# Patient Record
Sex: Female | Born: 1935 | Race: White | Hispanic: No | Marital: Married | Smoking: Never smoker
Health system: Southern US, Community
[De-identification: ages and names within clinical notes are randomized; demographics above are authoritative.]

## PROBLEM LIST (undated history)

## (undated) DIAGNOSIS — C4490 Unspecified malignant neoplasm of skin, unspecified: Secondary | ICD-10-CM

## (undated) DIAGNOSIS — E119 Type 2 diabetes mellitus without complications: Secondary | ICD-10-CM

## (undated) DIAGNOSIS — F0390 Unspecified dementia without behavioral disturbance: Secondary | ICD-10-CM

---

## 2015-11-21 DIAGNOSIS — F02818 Dementia in other diseases classified elsewhere, unspecified severity, with other behavioral disturbance: Secondary | ICD-10-CM | POA: Diagnosis present

## 2015-11-21 DIAGNOSIS — G301 Alzheimer's disease with late onset: Secondary | ICD-10-CM

## 2015-11-21 DIAGNOSIS — F0281 Dementia in other diseases classified elsewhere with behavioral disturbance: Secondary | ICD-10-CM | POA: Diagnosis present

## 2016-02-20 DIAGNOSIS — E034 Atrophy of thyroid (acquired): Secondary | ICD-10-CM | POA: Diagnosis present

## 2016-07-14 ENCOUNTER — Emergency Department
Admission: EM | Admit: 2016-07-14 | Discharge: 2016-07-14 | Disposition: A | Discharge status: Home / Self Care | Payer: Medicare Other | Attending: Student in an Organized Health Care Education/Training Program | Admitting: Student in an Organized Health Care Education/Training Program

## 2016-07-14 ENCOUNTER — Encounter: Payer: Self-pay | Admitting: *Deleted

## 2016-07-14 DIAGNOSIS — R531 Weakness: Secondary | ICD-10-CM

## 2016-07-14 DIAGNOSIS — N3000 Acute cystitis without hematuria: Secondary | ICD-10-CM | POA: Diagnosis not present

## 2016-07-14 DIAGNOSIS — Z791 Long term (current) use of non-steroidal anti-inflammatories (NSAID): Secondary | ICD-10-CM | POA: Diagnosis not present

## 2016-07-14 HISTORY — DX: Unspecified dementia, unspecified severity, without behavioral disturbance, psychotic disturbance, mood disturbance, and anxiety: F03.90

## 2016-07-14 LAB — CBC
HCT: 35.2 % (ref 35.0–47.0)
Hemoglobin: 12.1 g/dL (ref 12.0–16.0)
MCH: 31.8 pg (ref 26.0–34.0)
MCHC: 34.4 g/dL (ref 32.0–36.0)
MCV: 92.4 fL (ref 80.0–100.0)
PLATELETS: 231 10*3/uL (ref 150–440)
RBC: 3.81 MIL/uL (ref 3.80–5.20)
RDW: 14.6 % — AB (ref 11.5–14.5)
WBC: 9.7 10*3/uL (ref 3.6–11.0)

## 2016-07-14 LAB — URINALYSIS, COMPLETE (UACMP) WITH MICROSCOPIC
BACTERIA UA: NONE SEEN
BILIRUBIN URINE: NEGATIVE
GLUCOSE, UA: NEGATIVE mg/dL
Ketones, ur: NEGATIVE mg/dL
NITRITE: POSITIVE — AB
PH: 6 (ref 5.0–8.0)
Protein, ur: 30 mg/dL — AB
Specific Gravity, Urine: 1.012 (ref 1.005–1.030)

## 2016-07-14 LAB — BASIC METABOLIC PANEL
Anion gap: 9 (ref 5–15)
BUN: 17 mg/dL (ref 6–20)
CALCIUM: 8.5 mg/dL — AB (ref 8.9–10.3)
CO2: 23 mmol/L (ref 22–32)
CREATININE: 0.76 mg/dL (ref 0.44–1.00)
Chloride: 105 mmol/L (ref 101–111)
GFR calc Af Amer: >60 mL/min (ref >60)
GLUCOSE: 113 mg/dL — AB (ref 65–99)
POTASSIUM: 3.6 mmol/L (ref 3.5–5.1)
SODIUM: 137 mmol/L (ref 135–145)

## 2016-07-14 MED ORDER — CEFTRIAXONE SODIUM-DEXTROSE 1-3.74 GM-% IV SOLR
1.0000 g | Freq: Once | INTRAVENOUS | Status: AC
  Administered 2016-07-14: 1 g via INTRAVENOUS
  Filled 2016-07-14: qty 50

## 2016-07-14 MED ORDER — CEPHALEXIN 500 MG PO CAPS: 500.0000 mg | ORAL_CAPSULE | Freq: Three times a day (TID) | ORAL | 0 refills | Status: AC

## 2016-07-14 MED ORDER — SODIUM CHLORIDE 0.9 % IV BOLUS (SEPSIS)
1000.0000 mL | Freq: Once | INTRAVENOUS | Status: AC
  Administered 2016-07-14: 1000 mL via INTRAVENOUS

## 2016-07-14 MED ORDER — DEXTROSE 5 % IV SOLN: 1.0000 g | Freq: Once | INTRAVENOUS | Status: DC

## 2016-07-14 NOTE — ED Triage Notes (Signed)
Daughter states pt had several bowel movements yesterday, states today she continued to have bowel movements but states she could barley walk, daughter states she does not drink much fluids, hx of dementia

## 2016-07-14 NOTE — ED Provider Notes (Addendum)
[facility] Emergency Department Provider Note    First MD Initiated Contact with Patient 07/14/16 1500     (approximate)  I have reviewed the triage vital signs and the nursing notes.   HISTORY  Chief Complaint Weakness  Level V Caveat:  dementia HPI Suzanne Contreras is a 83 y.o. female presents after episode of weakness and staggering gait after getting out of the bathroom today.  History provided by daughter.  Patient was at home with her daughter. Has significant dementia. Daughter states that she has strong line with keeping her well-hydrated at home. No measured fevers. No chest pain or shortness of breath no nausea or vomiting. Does report increased bowel movements but no significant diarrhea. States that coming out of the bathroom today she had an unsteady gait which is unusual for her. Daughter brought her to be checked out.  Patient denies any pain at this time.   Past Medical History:  Diagnosis Date  . Dementia    No family history on file. History reviewed. No pertinent surgical history. There are no active problems to display for this patient.     Prior to Admission medications   Medication Sig Start Date End Date Taking? Authorizing Provider  naproxen sodium (ANAPROX) 220 MG tablet Take 220 mg by mouth 2 (two) times daily with a meal.   Yes Historical Provider, MD    Allergies Penicillins    Social History Social History  Substance Use Topics  . Smoking status: Never Smoker  . Smokeless tobacco: Never Used  . Alcohol use No    Review of Systems Patient denies headaches, rhinorrhea, blurry vision, numbness, shortness of breath, chest pain, edema, cough, abdominal pain, nausea, vomiting, diarrhea, dysuria, fevers, rashes or hallucinations unless otherwise stated above in HPI. ____________________________________________   PHYSICAL EXAM:  VITAL SIGNS: Vitals:   07/14/16 1210 07/14/16 1502  BP: (!) 145/116 (!) 125/108    Pulse: 74 78  Resp: 16 16  Temp: 98.8 F (37.1 C)     Constitutional: Alert, elderly appearing and in no acute distress. Eyes: Conjunctivae are normal. PERRL. EOMI. Head: Atraumatic. Nose: No congestion/rhinnorhea. Mouth/Throat: Mucous membranes are moist.  Oropharynx non-erythematous. Neck: No stridor. Painless ROM. No cervical spine tenderness to palpation Hematological/Lymphatic/Immunilogical: No cervical lymphadenopathy. Cardiovascular: Normal rate, regular rhythm. Grossly normal heart sounds.  Good peripheral circulation. Respiratory: Normal respiratory effort.  No retractions. Lungs CTAB. Gastrointestinal: Soft and nontender. No distention. No abdominal bruits. No CVA tenderness. Musculoskeletal: No lower extremity tenderness nor edema.  No joint effusions. Neurologic:  Normal speech  No gross focal neurologic deficits are appreciated. Nno facial droop.  MAE spontaneously Skin:  Skin is warm, dry and intact. No rash noted. Psychiatric: Mood and affect are normal. Speech and behavior are normal.  ____________________________________________   LABS (all labs ordered are listed, but only abnormal results are displayed)  Results for orders placed or performed during the hospital encounter of 07/14/16 (from the past 24 hour(s))  Basic metabolic panel     Status: Abnormal   Collection Time: 07/14/16 12:18 PM  Result Value Ref Range   Sodium 137 135 - 145 mmol/L   Potassium 3.6 3.5 - 5.1 mmol/L   Chloride 105 101 - 111 mmol/L   CO2 23 22 - 32 mmol/L   Glucose, Bld 113 (H) 65 - 99 mg/dL   BUN 17 6 - 20 mg/dL   Creatinine, Ser 0.76 0.44 - 1.00 mg/dL   Calcium 8.5 (L) 8.9 - 10.3 mg/dL  GFR calc non Af Amer >60 >60 mL/min   GFR calc Af Amer >60 >60 mL/min   Anion gap 9 5 - 15  CBC     Status: Abnormal   Collection Time: 07/14/16 12:18 PM  Result Value Ref Range   WBC 9.7 3.6 - 11.0 K/uL   RBC 3.81 3.80 - 5.20 MIL/uL   Hemoglobin 12.1 12.0 - 16.0 g/dL   HCT 35.2 35.0 -  47.0 %   MCV 92.4 80.0 - 100.0 fL   MCH 31.8 26.0 - 34.0 pg   MCHC 34.4 32.0 - 36.0 g/dL   RDW 14.6 (H) 11.5 - 14.5 %   Platelets 231 150 - 440 K/uL  Urinalysis, Complete w Microscopic     Status: Abnormal   Collection Time: 07/14/16  2:04 PM  Result Value Ref Range   Color, Urine YELLOW (A) YELLOW   APPearance HAZY (A) CLEAR   Specific Gravity, Urine 1.012 1.005 - 1.030   pH 6.0 5.0 - 8.0   Glucose, UA NEGATIVE NEGATIVE mg/dL   Hgb urine dipstick SMALL (A) NEGATIVE   Bilirubin Urine NEGATIVE NEGATIVE   Ketones, ur NEGATIVE NEGATIVE mg/dL   Protein, ur 30 (A) NEGATIVE mg/dL   Nitrite POSITIVE (A) NEGATIVE   Leukocytes, UA LARGE (A) NEGATIVE   RBC / HPF 0-5 0 - 5 RBC/hpf   WBC, UA TOO NUMEROUS TO COUNT 0 - 5 WBC/hpf   Bacteria, UA NONE SEEN NONE SEEN   Squamous Epithelial / LPF 0-5 (A) NONE SEEN   Mucous PRESENT    ____________________________________________  EKG ____________________________________________   ____________________________________________   PROCEDURES  Procedure(s) performed:  Procedures    Critical Care performed: no ____________________________________________   INITIAL IMPRESSION / ASSESSMENT AND PLAN / ED COURSE  Pertinent labs & imaging results that were available during my care of the patient were reviewed by me and considered in my medical decision making (see chart for details).  DDX: dehydration, flu like illness, uti,   Suzanne Contreras is a 83 y.o. who presents to the ED with weakness as described above occurred today. Also concern for dehydration. Patient afebrile and hemodynamically stable. Blood work is reassuring. Urine does show evidence of UTI. Abdominal exam otherwise reassuring. Denies any chest pain or shortness of breath to suggest any cardiac event and EKG also reassuring. We'll provide IV fluids as well as an IV dose of antibiotics to treat UTI. Spoke with the patient's daughter regarding admission to hospital for further  monitoring and IV antibiotics. She states the patient would fare better at home with less agitation given her history of dementia. She has no other signs of systemic illness a do feel that the reasonable option. Patient was able to tolerate PO and was able to ambulate with a steady gait.  Have discussed with the patient and available family all diagnostics and treatments performed thus far and all questions were answered to the best of my ability. The patient demonstrates understanding and agreement with plan.       ____________________________________________   FINAL CLINICAL IMPRESSION(S) / ED DIAGNOSES  Final diagnoses:  Acute cystitis without hematuria  Weakness      NEW MEDICATIONS STARTED DURING THIS VISIT:  New Prescriptions   No medications on file     Note:  This document was prepared using Dragon voice recognition software and may include unintentional dictation errors.    Merlyn Lot, MD 07/15/16 [id number]    Merlyn Lot, MD 07/15/16 [phone]

## 2016-07-17 LAB — URINE CULTURE
Culture: >=100000 — AB
SPECIAL REQUESTS: NORMAL

## 2016-10-22 DIAGNOSIS — N949 Unspecified condition associated with female genital organs and menstrual cycle: Secondary | ICD-10-CM | POA: Diagnosis present

## 2016-12-10 ENCOUNTER — Observation Stay
Admission: EM | Admit: 2016-12-10 | Discharge: 2016-12-14 | Disposition: A | Discharge status: Home / Self Care | Payer: Medicare Other | Attending: Internal Medicine | Admitting: Internal Medicine

## 2016-12-10 ENCOUNTER — Emergency Department: Payer: Medicare Other

## 2016-12-10 ENCOUNTER — Encounter: Payer: Self-pay | Admitting: Emergency Medicine

## 2016-12-10 DIAGNOSIS — E034 Atrophy of thyroid (acquired): Secondary | ICD-10-CM | POA: Diagnosis not present

## 2016-12-10 DIAGNOSIS — E860 Dehydration: Secondary | ICD-10-CM | POA: Insufficient documentation

## 2016-12-10 DIAGNOSIS — F0280 Dementia in other diseases classified elsewhere without behavioral disturbance: Secondary | ICD-10-CM | POA: Diagnosis not present

## 2016-12-10 DIAGNOSIS — R410 Disorientation, unspecified: Secondary | ICD-10-CM | POA: Diagnosis present

## 2016-12-10 DIAGNOSIS — Z818 Family history of other mental and behavioral disorders: Secondary | ICD-10-CM | POA: Diagnosis not present

## 2016-12-10 DIAGNOSIS — R627 Adult failure to thrive: Secondary | ICD-10-CM | POA: Diagnosis not present

## 2016-12-10 DIAGNOSIS — Z85828 Personal history of other malignant neoplasm of skin: Secondary | ICD-10-CM | POA: Diagnosis not present

## 2016-12-10 DIAGNOSIS — N390 Urinary tract infection, site not specified: Secondary | ICD-10-CM | POA: Insufficient documentation

## 2016-12-10 DIAGNOSIS — N3090 Cystitis, unspecified without hematuria: Secondary | ICD-10-CM

## 2016-12-10 DIAGNOSIS — F02818 Dementia in other diseases classified elsewhere, unspecified severity, with other behavioral disturbance: Secondary | ICD-10-CM | POA: Diagnosis present

## 2016-12-10 DIAGNOSIS — R112 Nausea with vomiting, unspecified: Secondary | ICD-10-CM | POA: Insufficient documentation

## 2016-12-10 DIAGNOSIS — N949 Unspecified condition associated with female genital organs and menstrual cycle: Secondary | ICD-10-CM | POA: Diagnosis present

## 2016-12-10 DIAGNOSIS — Z791 Long term (current) use of non-steroidal anti-inflammatories (NSAID): Secondary | ICD-10-CM | POA: Diagnosis not present

## 2016-12-10 DIAGNOSIS — R4182 Altered mental status, unspecified: Secondary | ICD-10-CM | POA: Diagnosis present

## 2016-12-10 DIAGNOSIS — D649 Anemia, unspecified: Secondary | ICD-10-CM | POA: Insufficient documentation

## 2016-12-10 DIAGNOSIS — E1165 Type 2 diabetes mellitus with hyperglycemia: Secondary | ICD-10-CM | POA: Insufficient documentation

## 2016-12-10 DIAGNOSIS — F0281 Dementia in other diseases classified elsewhere with behavioral disturbance: Secondary | ICD-10-CM | POA: Diagnosis present

## 2016-12-10 DIAGNOSIS — Z880 Allergy status to penicillin: Secondary | ICD-10-CM | POA: Insufficient documentation

## 2016-12-10 DIAGNOSIS — G301 Alzheimer's disease with late onset: Secondary | ICD-10-CM | POA: Diagnosis not present

## 2016-12-10 DIAGNOSIS — N899 Noninflammatory disorder of vagina, unspecified: Secondary | ICD-10-CM | POA: Insufficient documentation

## 2016-12-10 HISTORY — DX: Type 2 diabetes mellitus without complications: E11.9

## 2016-12-10 LAB — CBC WITH DIFFERENTIAL/PLATELET
BASOS ABS: 0.1 10*3/uL (ref 0–0.1)
Basophils Relative: 1 %
Eosinophils Absolute: 0.1 10*3/uL (ref 0–0.7)
Eosinophils Relative: 1 %
HEMATOCRIT: 34.1 % — AB (ref 35.0–47.0)
HEMOGLOBIN: 11.5 g/dL — AB (ref 12.0–16.0)
LYMPHS PCT: 20 %
Lymphs Abs: 1.6 10*3/uL (ref 1.0–3.6)
MCH: 31.3 pg (ref 26.0–34.0)
MCHC: 33.8 g/dL (ref 32.0–36.0)
MCV: 92.5 fL (ref 80.0–100.0)
MONO ABS: 0.4 10*3/uL (ref 0.2–0.9)
MONOS PCT: 5 %
NEUTROS ABS: 5.7 10*3/uL (ref 1.4–6.5)
Neutrophils Relative %: 73 %
Platelets: 251 10*3/uL (ref 150–440)
RBC: 3.69 MIL/uL — ABNORMAL LOW (ref 3.80–5.20)
RDW: 14.2 % (ref 11.5–14.5)
WBC: 7.8 10*3/uL (ref 3.6–11.0)

## 2016-12-10 LAB — BASIC METABOLIC PANEL
ANION GAP: 8 (ref 5–15)
BUN: 22 mg/dL — ABNORMAL HIGH (ref 6–20)
CO2: 23 mmol/L (ref 22–32)
Calcium: 9.4 mg/dL (ref 8.9–10.3)
Chloride: 109 mmol/L (ref 101–111)
Creatinine, Ser: 0.75 mg/dL (ref 0.44–1.00)
GFR calc Af Amer: >60 mL/min (ref >60)
GFR calc non Af Amer: >60 mL/min (ref >60)
GLUCOSE: 112 mg/dL — AB (ref 65–99)
Potassium: 3.9 mmol/L (ref 3.5–5.1)
Sodium: 140 mmol/L (ref 135–145)

## 2016-12-10 LAB — URINALYSIS, COMPLETE (UACMP) WITH MICROSCOPIC
Bilirubin Urine: NEGATIVE
GLUCOSE, UA: NEGATIVE mg/dL
Hgb urine dipstick: NEGATIVE
Ketones, ur: 5 mg/dL — AB
Nitrite: POSITIVE — AB
PH: 6 (ref 5.0–8.0)
Protein, ur: NEGATIVE mg/dL
Specific Gravity, Urine: 1.018 (ref 1.005–1.030)

## 2016-12-10 LAB — MAGNESIUM: Magnesium: 1.7 mg/dL (ref 1.7–2.4)

## 2016-12-10 LAB — T4, FREE: Free T4: 0.91 ng/dL (ref 0.61–1.12)

## 2016-12-10 LAB — TSH: TSH: 10.883 u[IU]/mL — ABNORMAL HIGH (ref 0.350–4.500)

## 2016-12-10 MED ORDER — LORAZEPAM 2 MG/ML IJ SOLN
INTRAMUSCULAR | Status: AC
  Filled 2016-12-10: qty 1

## 2016-12-10 MED ORDER — ONDANSETRON HCL 4 MG PO TABS: 4.0000 mg | ORAL_TABLET | Freq: Four times a day (QID) | ORAL | Status: DC | PRN

## 2016-12-10 MED ORDER — DEXTROSE 5 % IV SOLN
1.0000 g | Freq: Once | INTRAVENOUS | Status: AC
  Administered 2016-12-10: 1 g via INTRAVENOUS
  Filled 2016-12-10: qty 10

## 2016-12-10 MED ORDER — HALOPERIDOL LACTATE 5 MG/ML IJ SOLN
1.0000 mg | Freq: Four times a day (QID) | INTRAMUSCULAR | Status: DC | PRN
  Administered 2016-12-10: 22:00:00 1 mg via INTRAVENOUS
  Filled 2016-12-10: qty 1

## 2016-12-10 MED ORDER — LORAZEPAM 2 MG/ML IJ SOLN
1.0000 mg | Freq: Once | INTRAMUSCULAR | Status: AC
  Administered 2016-12-10: 1 mg via INTRAVENOUS

## 2016-12-10 MED ORDER — ACETAMINOPHEN 325 MG PO TABS: 650.0000 mg | ORAL_TABLET | Freq: Four times a day (QID) | ORAL | Status: DC | PRN

## 2016-12-10 MED ORDER — HALOPERIDOL LACTATE 5 MG/ML IJ SOLN
0.5000 mg | Freq: Three times a day (TID) | INTRAMUSCULAR | Status: DC | PRN
  Administered 2016-12-12: 21:00:00 0.5 mg via INTRAVENOUS
  Filled 2016-12-10: qty 1

## 2016-12-10 MED ORDER — ENOXAPARIN SODIUM 40 MG/0.4ML ~~LOC~~ SOLN
40.0000 mg | SUBCUTANEOUS | Status: DC
  Administered 2016-12-10 – 2016-12-13 (×4): 40 mg via SUBCUTANEOUS
  Filled 2016-12-10 (×4): qty 0.4

## 2016-12-10 MED ORDER — TRAZODONE HCL 50 MG PO TABS
25.0000 mg | ORAL_TABLET | Freq: Every day | ORAL | Status: DC
  Administered 2016-12-10 – 2016-12-13 (×4): 25 mg via ORAL
  Filled 2016-12-10 (×4): qty 1

## 2016-12-10 MED ORDER — LORAZEPAM 2 MG/ML IJ SOLN
1.0000 mg | INTRAMUSCULAR | Status: DC | PRN
  Administered 2016-12-10: 1 mg via INTRAVENOUS
  Filled 2016-12-10: qty 1

## 2016-12-10 MED ORDER — ACETAMINOPHEN 650 MG RE SUPP: 650.0000 mg | Freq: Four times a day (QID) | RECTAL | Status: DC | PRN

## 2016-12-10 MED ORDER — HALOPERIDOL LACTATE 5 MG/ML IJ SOLN
2.5000 mg | Freq: Once | INTRAMUSCULAR | Status: AC
  Administered 2016-12-10: 5 mg via INTRAVENOUS

## 2016-12-10 MED ORDER — HALOPERIDOL 0.5 MG PO TABS
0.5000 mg | ORAL_TABLET | Freq: Three times a day (TID) | ORAL | Status: DC
  Administered 2016-12-10 – 2016-12-14 (×9): 0.5 mg via ORAL
  Filled 2016-12-10 (×14): qty 1

## 2016-12-10 MED ORDER — HALOPERIDOL LACTATE 5 MG/ML IJ SOLN
5.0000 mg | Freq: Once | INTRAMUSCULAR | Status: AC
  Administered 2016-12-10: 5 mg via INTRAVENOUS

## 2016-12-10 MED ORDER — DEXTROSE 5 % IV SOLN
1.0000 g | INTRAVENOUS | Status: DC
  Administered 2016-12-11 – 2016-12-14 (×4): 1 g via INTRAVENOUS
  Filled 2016-12-10 (×4): qty 10

## 2016-12-10 MED ORDER — RISPERIDONE 0.5 MG PO TBDP
0.5000 mg | ORAL_TABLET | Freq: Two times a day (BID) | ORAL | Status: DC
  Filled 2016-12-10: qty 1

## 2016-12-10 MED ORDER — ONDANSETRON HCL 4 MG/2ML IJ SOLN: 4.0000 mg | Freq: Four times a day (QID) | INTRAMUSCULAR | Status: DC | PRN

## 2016-12-10 MED ORDER — HALOPERIDOL LACTATE 5 MG/ML IJ SOLN
INTRAMUSCULAR | Status: AC
  Administered 2016-12-10: 5 mg via INTRAVENOUS
  Filled 2016-12-10: qty 1

## 2016-12-10 MED ORDER — KCL IN DEXTROSE-NACL 20-5-0.45 MEQ/L-%-% IV SOLN
INTRAVENOUS | Status: DC
  Administered 2016-12-10 – 2016-12-12 (×6): via INTRAVENOUS
  Filled 2016-12-10 (×7): qty 1000

## 2016-12-10 MED ORDER — HALOPERIDOL LACTATE 5 MG/ML IJ SOLN
2.5000 mg | Freq: Once | INTRAMUSCULAR | Status: DC
  Filled 2016-12-10: qty 1
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
## 2016-12-10 NOTE — ED Notes (Addendum)
Patient remains agitated. Attempting to get out of bed and take off gown. MD aware.

## 2016-12-10 NOTE — ED Notes (Signed)
Patient no longer attempting to get out of bed or take off gown. Patient resting with eyes closed. Even and non labored respirations noted.

## 2016-12-10 NOTE — ED Notes (Signed)
Bed alarm placed on patient and on. Yellow fall risk bracelet applied as well as yellow socks.
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
## 2016-12-10 NOTE — Progress Notes (Signed)
Patient ID: Suzanne Contreras, female   DOB: 02-Dec-1929, 83 y.o.   MRN: [id number]         Advanced care planning    Present. Patient [name] DR Ether Griffins The patient's daughter , Mrs [name]   Diagnoses: Alzheimer's dementia with acute delirium Urinary tract infection Dehydration Nausea and vomiting Failure to thrive, adult Anemia Hyperglycemia    I discussed this with patient's daughter advance care planning, including patient's CODE STATUS, patient's daughter confirmed that patient would not want to be intubated, to have CPR performed, or receive electrical shock, however, she would not mind her to receive her IV fluids, nutrition, medications for heart rate control and arrhythmias. Patient is limited code. Emotional support was provided.    Time spent 16 minutes

## 2016-12-10 NOTE — ED Notes (Signed)
Patient remains agitated and is still hallucinating. MD aware.

## 2016-12-10 NOTE — ED Notes (Addendum)
Konrad Dolores, ED Medic at bedside as a Air cabin crew.

## 2016-12-10 NOTE — ED Notes (Signed)
Pt trying to climb out of bed. Family is on their way in. Nurse at bedside for safety

## 2016-12-10 NOTE — ED Notes (Signed)
In and out cath completed by this RN. Assisted by Konrad Dolores, ED Medic and Ronalee Belts, ED Medic. Sterile technique maintained. Patient tolerated well. Cloudy, amber urine returned.

## 2016-12-10 NOTE — ED Notes (Signed)
Suzanne Contreras, ED Medic has left bedside as Air cabin crew. Door to room open. Patient not attempting to get out of bed at this time. Bed alarm remains on. Will continue monitor.
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
## 2016-12-11 DIAGNOSIS — N390 Urinary tract infection, site not specified: Secondary | ICD-10-CM | POA: Diagnosis not present

## 2016-12-11 DIAGNOSIS — R410 Disorientation, unspecified: Secondary | ICD-10-CM | POA: Diagnosis not present

## 2016-12-11 LAB — CBC
HEMATOCRIT: 37.6 % (ref 35.0–47.0)
HEMOGLOBIN: 12.6 g/dL (ref 12.0–16.0)
MCH: 31.1 pg (ref 26.0–34.0)
MCHC: 33.4 g/dL (ref 32.0–36.0)
MCV: 93.1 fL (ref 80.0–100.0)
Platelets: 230 10*3/uL (ref 150–440)
RBC: 4.04 MIL/uL (ref 3.80–5.20)
RDW: 14.2 % (ref 11.5–14.5)
WBC: 7.8 10*3/uL (ref 3.6–11.0)

## 2016-12-11 LAB — BASIC METABOLIC PANEL
ANION GAP: 7 (ref 5–15)
BUN: 13 mg/dL (ref 6–20)
CALCIUM: 8.9 mg/dL (ref 8.9–10.3)
CO2: 27 mmol/L (ref 22–32)
Chloride: 107 mmol/L (ref 101–111)
Creatinine, Ser: 0.69 mg/dL (ref 0.44–1.00)
Glucose, Bld: 127 mg/dL — ABNORMAL HIGH (ref 65–99)
POTASSIUM: 4 mmol/L (ref 3.5–5.1)
SODIUM: 141 mmol/L (ref 135–145)

## 2016-12-11 LAB — GLUCOSE, CAPILLARY: GLUCOSE-CAPILLARY: 111 mg/dL — AB (ref 65–99)

## 2016-12-11 LAB — HEMOGLOBIN A1C
Hgb A1c MFr Bld: 5 % (ref 4.8–5.6)
MEAN PLASMA GLUCOSE: 97 mg/dL

## 2016-12-11 MED ORDER — LEVOTHYROXINE SODIUM 100 MCG IV SOLR
12.5000 ug | Freq: Every day | INTRAVENOUS | Status: DC
  Administered 2016-12-13 – 2016-12-14 (×2): 12.5 ug via INTRAVENOUS
  Filled 2016-12-11 (×4): qty 5

## 2016-12-11 NOTE — Progress Notes (Signed)
PT Cancellation Note  Patient Details Name: Suzanne Contreras MRN: [id number] DOB: [birth year]   Cancelled Treatment:    Reason Eval/Treat Not Completed: Other (comment) (Consult received and chart reviewed.  Evaluation attempted.  Patiently constantly fidgeting throughout session, resisting assisted movement by therapist, occasionally swatting/swinging at therapist during attempts.  Constantly talking/mumbling non-sensically throughout session. Unable to redirect.  Unable to follow commands or actively participation with formal evaluation at this time.  Will re-attempt evaluation at later time/date as cognitive status allows.)   Jerrell Hart H. Owens Shark, PT, DPT, NCS 12/11/16, 9:49 AM [phone]
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
## 2016-12-11 NOTE — Care Management Note (Signed)
Case Management Note  Patient Details  Name: Suzanne Contreras MRN: [id number] Date of Birth: 1929/07/25  Subjective/Objective:         Admitted to [facility] with the diagnosis of delirium under observation status. Lives with daughter, [name], [phone]) x 1 year.  Rotates living with her children. Last seen Dr. Roland Rack  11/25/16. This physician is part of th REACH team that does home visits. Daughter would like to get another physician for her mother. Discussed [facility] and [facility]. Prescriptions are filled at [facility] or [facility] which ever drug store has the least price per Good RX. Self feeds, but needs prompting.  Needs help with dressing and baths. Raised toilet seat, grab bars, shower seat and rolling walker in the home.  Family will transport.   Action/Plan: Will continue continue to follow for discharge plans Sitter at the bedside   Expected Discharge Date:                  Expected Discharge Plan:     In-House Referral:     Discharge planning Services     Post Acute Care Choice:    Choice offered to:     DME Arranged:    DME Agency:     HH Arranged:    [place] Agency:     Status of Service:     If discussed at [employer], dates discussed:    Additional Comments:  Shelbie Ammons, [place] Management [phone] 12/11/2016, 12:21 PM

## 2016-12-11 NOTE — Care Management Obs Status (Signed)
[place] NOTIFICATION   Patient Details  Name: KAMALJIT HIZER MRN: [id number] Date of Birth: Oct 03, 1929   Medicare Observation Status Notification Given:  Yes. Verbal permission per daughter Ethlyn Daniels, RN 12/11/2016, 12:32 PM

## 2016-12-11 NOTE — Progress Notes (Signed)
[place] at [facility]                                                                                                                                                                                  Patient Demographics   Suzanne Contreras, is a 83 y.o. female, DOB - February 16, 1930, GYB:[id number]  Admit date - 12/10/2016   Admitting Physician [name], MD  Outpatient Primary MD for the patient is Patient, No Pcp Per   LOS - 0  Subjective: Pt confused, unable to give ros due to confusion    Review of Systems:   CONSTITUTIONAL:confused  Vitals:   Vitals:   12/10/16 1923 12/10/16 2320 12/11/16 0402 12/11/16 1418  BP: 128/90 132/77 139/90 (!) 105/91  Pulse: (!) 106 95 92 93  Resp: (!) 22 18 18    Temp: 97.6 F (36.4 C)   98.2 F (36.8 C)  TempSrc: Oral   Oral  SpO2: (!) 83% 100% 100% 95%  Weight:      Height:        Wt Readings from Last 3 Encounters:  12/10/16 125 lb (56.7 kg)  07/14/16 160 lb (72.6 kg)     Intake/Output Summary (Last 24 hours) at 12/11/16 1427 Last data filed at 12/11/16 0551  Gross per 24 hour  Intake           213.75 ml  Output                0 ml  Net           213.75 ml    Physical Exam:   GENERAL: Chronically ill appearing confused  HEAD, EYES, EARS, NOSE AND THROAT: Atraumatic, normocephalic. Extraocular muscles are intact. Pupils equal and reactive to light. Sclerae anicteric. No conjunctival injection. No oro-pharyngeal erythema.  NECK: Supple. There is no jugular venous distention. No bruits, no lymphadenopathy, no thyromegaly.  HEART: Regular rate and rhythm,. No murmurs, no rubs, no clicks.  LUNGS: Clear to auscultation bilaterally. No rales or rhonchi. No wheezes.  ABDOMEN: Soft, flat, nontender, nondistended. Has good bowel sounds. No hepatosplenomegaly appreciated.  EXTREMITIES: No evidence of any cyanosis, clubbing, or peripheral edema.  +2 pedal and radial pulses bilaterally.  NEUROLOGIC: Confused SKIN:  Moist and warm with no rashes appreciated.  Psych: Not anxious, depressed LN: No inguinal LN enlargement    Antibiotics   Anti-infectives    Start     Dose/Rate Route Frequency Ordered Stop   12/11/16 1000  cefTRIAXone (ROCEPHIN) 1 g in dextrose 5 % 50 mL IVPB     1 g 100 mL/hr over 30 Minutes Intravenous Every 24 hours 12/10/16 1222  12/10/16 1130  cefTRIAXone (ROCEPHIN) 1 g in dextrose 5 % 50 mL IVPB     1 g 100 mL/hr over 30 Minutes Intravenous  Once 12/10/16 1117 12/11/16 1021      Medications   Scheduled Meds: . enoxaparin (LOVENOX) injection  40 mg Subcutaneous Q24H  . haloperidol  0.5 mg Oral TID  . traZODone  25 mg Oral QHS   Continuous Infusions: . cefTRIAXone (ROCEPHIN)  IV Stopped (12/11/16 1053)  . dextrose 5 % and 0.45 % NaCl with KCl 20 mEq/L 75 mL/hr at 12/11/16 0539   PRN Meds:.acetaminophen **OR** acetaminophen, haloperidol lactate, haloperidol lactate, ondansetron **OR** ondansetron (ZOFRAN) IV   Data Review:   Micro Results Recent Results (from the past 240 hour(s))  Urine culture     Status: Abnormal (Preliminary result)   Collection Time: 12/10/16  9:34 AM  Result Value Ref Range Status   Specimen Description URINE, CATHETERIZED  Final   Special Requests NONE  Final   Culture (A)  Final    >=100,000 COLONIES/mL ESCHERICHIA COLI SUSCEPTIBILITIES TO FOLLOW Performed at [facility], [address] [place], [place] [address]    Report Status PENDING  Incomplete    Radiology Reports Dg Chest Portable 1 View  Result Date: 12/10/2016 CLINICAL DATA:  Altered mental status, history of dementia, hallucinations EXAM: PORTABLE CHEST 1 VIEW COMPARISON:  None. FINDINGS: No active infiltrate or effusion is seen. Mediastinal and hilar contours are unremarkable. Minimal volume loss is present at the left lung base with slight elevation of the left hemidiaphragm. The heart is within upper limits of normal. No acute bony abnormality is seen.  IMPRESSION: No definite active cardiopulmonary disease. Slight elevation of the left hemidiaphragm. Electronically Signed   By: Ivar Drape M.D.   On: 12/10/2016 10:00     CBC  Recent Labs Lab 12/10/16 0935 12/11/16 0337  WBC 7.8 7.8  HGB 11.5* 12.6  HCT 34.1* 37.6  PLT 251 230  MCV 92.5 93.1  MCH 31.3 31.1  MCHC 33.8 33.4  RDW 14.2 14.2  LYMPHSABS 1.6  --   MONOABS 0.4  --   EOSABS 0.1  --   BASOSABS 0.1  --     Chemistries   Recent Labs Lab 12/10/16 0935 12/11/16 0337  NA 140 141  K 3.9 4.0  CL 109 107  CO2 23 27  GLUCOSE 112* 127*  BUN 22* 13  CREATININE 0.75 0.69  CALCIUM 9.4 8.9  MG 1.7  --    ------------------------------------------------------------------------------------------------------------------ estimated creatinine clearance is 40.6 mL/min (by C-G formula based on SCr of 0.69 mg/dL). ------------------------------------------------------------------------------------------------------------------  Recent Labs  12/10/16 1359  HGBA1C 5.0   ------------------------------------------------------------------------------------------------------------------ No results for input(s): CHOL, HDL, LDLCALC, TRIG, CHOLHDL, LDLDIRECT in the last 72 hours. ------------------------------------------------------------------------------------------------------------------  Recent Labs  12/10/16 1359  TSH 10.883*   ------------------------------------------------------------------------------------------------------------------ No results for input(s): VITAMINB12, FOLATE, FERRITIN, TIBC, IRON, RETICCTPCT in the last 72 hours.  Coagulation profile No results for input(s): INR, PROTIME in the last 168 hours.  No results for input(s): DDIMER in the last 72 hours.  Cardiac Enzymes No results for input(s): CKMB, TROPONINI, MYOGLOBIN in the last 168 hours.  Invalid input(s):  CK ------------------------------------------------------------------------------------------------------------------ Invalid input(s): [place]   #1. Acute delirium, Could be related to urinary tract infection or worsening dementia we'll try cerebral at nighttime continue haloperidol and Ativan  #2. Urinary tract infection, continue Rocephin and await urine culture #3. An episode of nausea and vomiting, supportive  therapy, follow oral intake, continue antiemetics, IV fluids #4 elevated TSH.start Synthroid #5. Mild dehydration as based on elevated BUN, follow labs, continue IV fluids #6. Hyperglycemia, hemoglobin A1c is 5.0 #7. Dementia questionable progressive       Code Status Orders        Start     Ordered   12/10/16 1941  Limited resuscitation (code)  Continuous    Question Answer Comment  In the event of cardiac or respiratory ARREST: Initiate Code Blue, Call Rapid Response Yes   In the event of cardiac or respiratory ARREST: Perform CPR No   In the event of cardiac or respiratory ARREST: Perform Intubation/Mechanical Ventilation No   In the event of cardiac or respiratory ARREST: Use NIPPV/BiPAp only if indicated Yes   In the event of cardiac or respiratory ARREST: Administer ACLS medications if indicated Yes   In the event of cardiac or respiratory ARREST: Perform Defibrillation or Cardioversion if indicated No      12/10/16 1942    Code Status History    Date Active Date Inactive Code Status Order ID Comments User Context   12/10/2016  1:17 PM 12/10/2016  7:42 PM Full Code [id number]  [name], MD Inpatient   12/10/2016 12:21 PM 12/10/2016  1:17 PM Partial Code [id number] Discussed with patient's daughter, Mrs. Shoe today [name], MD ED           Consults  Psychiatry DVT Prophylaxis  Lovenox  Lab Results  Component Value Date   PLT 230 12/11/2016     Time Spent in minutes   60min  Greater than 50% of time spent in care  coordination and counseling patient regarding the condition and plan of care.   Dustin Flock M.D on 12/11/2016 at 2:27 PM  Between 7am to 6pm - Pager - [phone]  After 6pm go to [email or web address] - password [email or web address] [place] [facility]   Office  [phone]

## 2016-12-12 DIAGNOSIS — R410 Disorientation, unspecified: Secondary | ICD-10-CM | POA: Diagnosis present

## 2016-12-12 LAB — URINE CULTURE: Culture: >=100000 — AB

## 2016-12-12 LAB — OCCULT BLOOD X 1 CARD TO LAB, STOOL: Fecal Occult Bld: NEGATIVE

## 2016-12-12 LAB — GLUCOSE, CAPILLARY: Glucose-Capillary: 111 mg/dL — ABNORMAL HIGH (ref 65–99)
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
## 2016-12-12 NOTE — Progress Notes (Signed)
[place] at [facility]                                                                                                                                                                                  Patient Demographics   Suzanne Contreras, is a 83 y.o. female, DOB - 13-Jul-1929, NTI:[id number]  Admit date - 12/10/2016   Admitting Physician [name], MD  Outpatient Primary MD for the patient is Patient, No Pcp Per   LOS - 0  Subjective: Physical therapy was able to get her up more alert      Review of Systems:   CONSTITUTIONAL:confusion improved  Vitals:   Vitals:   12/11/16 1427 12/11/16 2008 12/12/16 0633 12/12/16 1300  BP: (!) 141/62 (!) 164/85 136/87 (!) 143/68  Pulse: 86 (!) 104 (!) 112 90  Resp: 17 20 18 20   Temp: 98 F (36.7 C) 98.1 F (36.7 C) 98.4 F (36.9 C) 97.6 F (36.4 C)  TempSrc: Tympanic Axillary Oral Axillary  SpO2: 98% 100% 100% 95%  Weight:      Height:        Wt Readings from Last 3 Encounters:  12/10/16 125 lb (56.7 kg)  07/14/16 160 lb (72.6 kg)     Intake/Output Summary (Last 24 hours) at 12/12/16 1352 Last data filed at 12/12/16 8676  Gross per 24 hour  Intake          1826.25 ml  Output                0 ml  Net          1826.25 ml    Physical Exam:   GENERAL: Chronically ill appearing confused  HEAD, EYES, EARS, NOSE AND THROAT: Atraumatic, normocephalic. Extraocular muscles are intact. Pupils equal and reactive to light. Sclerae anicteric. No conjunctival injection. No oro-pharyngeal erythema.  NECK: Supple. There is no jugular venous distention. No bruits, no lymphadenopathy, no thyromegaly.  HEART: Regular rate and rhythm,. No murmurs, no rubs, no clicks.  LUNGS: Clear to auscultation bilaterally. No rales or rhonchi. No wheezes.  ABDOMEN: Soft, flat, nontender, nondistended. Has good bowel sounds. No hepatosplenomegaly appreciated.  EXTREMITIES: No evidence of any cyanosis, clubbing, or peripheral  edema.  +2 pedal and radial pulses bilaterally.  NEUROLOGIC: Confused SKIN: Moist and warm with no rashes appreciated.  Psych: Not anxious, depressed LN: No inguinal LN enlargement    Antibiotics   Anti-infectives    Start     Dose/Rate Route Frequency Ordered Stop   12/11/16 1000  cefTRIAXone (ROCEPHIN) 1 g in dextrose 5 % 50 mL IVPB     1 g 100 mL/hr over 30 Minutes  Intravenous Every 24 hours 12/10/16 1222     12/10/16 1130  cefTRIAXone (ROCEPHIN) 1 g in dextrose 5 % 50 mL IVPB     1 g 100 mL/hr over 30 Minutes Intravenous  Once 12/10/16 1117 12/11/16 1021      Medications   Scheduled Meds: . enoxaparin (LOVENOX) injection  40 mg Subcutaneous Q24H  . haloperidol  0.5 mg Oral TID  . levothyroxine  12.5 mcg Intravenous Daily  . traZODone  25 mg Oral QHS   Continuous Infusions: . cefTRIAXone (ROCEPHIN)  IV Stopped (12/12/16 1006)  . dextrose 5 % and 0.45 % NaCl with KCl 20 mEq/L 75 mL/hr at 12/12/16 0922   PRN Meds:.acetaminophen **OR** acetaminophen, haloperidol lactate, haloperidol lactate, ondansetron **OR** ondansetron (ZOFRAN) IV   Data Review:   Micro Results Recent Results (from the past 240 hour(s))  Urine culture     Status: Abnormal   Collection Time: 12/10/16  9:34 AM  Result Value Ref Range Status   Specimen Description URINE, CATHETERIZED  Final   Special Requests NONE  Final   Culture >=100,000 COLONIES/mL ESCHERICHIA COLI (A)  Final   Report Status 12/12/2016 FINAL  Final   Organism ID, Bacteria ESCHERICHIA COLI (A)  Final      Susceptibility   Escherichia coli - MIC*    AMPICILLIN 4 SENSITIVE Sensitive     CEFAZOLIN <=4 SENSITIVE Sensitive     CEFTRIAXONE <=1 SENSITIVE Sensitive     CIPROFLOXACIN <=0.25 SENSITIVE Sensitive     GENTAMICIN <=1 SENSITIVE Sensitive     IMIPENEM <=0.25 SENSITIVE Sensitive     NITROFURANTOIN <=16 SENSITIVE Sensitive     TRIMETH/SULFA <=20 SENSITIVE Sensitive     AMPICILLIN/SULBACTAM <=2 SENSITIVE Sensitive      PIP/TAZO <=4 SENSITIVE Sensitive     Extended ESBL NEGATIVE Sensitive     * >=100,000 COLONIES/mL ESCHERICHIA COLI    Radiology Reports Dg Chest Portable 1 View  Result Date: 12/10/2016 CLINICAL DATA:  Altered mental status, history of dementia, hallucinations EXAM: PORTABLE CHEST 1 VIEW COMPARISON:  None. FINDINGS: No active infiltrate or effusion is seen. Mediastinal and hilar contours are unremarkable. Minimal volume loss is present at the left lung base with slight elevation of the left hemidiaphragm. The heart is within upper limits of normal. No acute bony abnormality is seen. IMPRESSION: No definite active cardiopulmonary disease. Slight elevation of the left hemidiaphragm. Electronically Signed   By: Ivar Drape M.D.   On: 12/10/2016 10:00     CBC  Recent Labs Lab 12/10/16 0935 12/11/16 0337  WBC 7.8 7.8  HGB 11.5* 12.6  HCT 34.1* 37.6  PLT 251 230  MCV 92.5 93.1  MCH 31.3 31.1  MCHC 33.8 33.4  RDW 14.2 14.2  LYMPHSABS 1.6  --   MONOABS 0.4  --   EOSABS 0.1  --   BASOSABS 0.1  --     Chemistries   Recent Labs Lab 12/10/16 0935 12/11/16 0337  NA 140 141  K 3.9 4.0  CL 109 107  CO2 23 27  GLUCOSE 112* 127*  BUN 22* 13  CREATININE 0.75 0.69  CALCIUM 9.4 8.9  MG 1.7  --    ------------------------------------------------------------------------------------------------------------------ estimated creatinine clearance is 40.6 mL/min (by C-G formula based on SCr of 0.69 mg/dL). ------------------------------------------------------------------------------------------------------------------  Recent Labs  12/10/16 1359  HGBA1C 5.0   ------------------------------------------------------------------------------------------------------------------ No results for input(s): CHOL, HDL, LDLCALC, TRIG, CHOLHDL, LDLDIRECT in the last 72 hours. ------------------------------------------------------------------------------------------------------------------  Recent  Labs  12/10/16 1359  TSH 10.883*   ------------------------------------------------------------------------------------------------------------------  No results for input(s): VITAMINB12, FOLATE, FERRITIN, TIBC, IRON, RETICCTPCT in the last 72 hours.  Coagulation profile No results for input(s): INR, PROTIME in the last 168 hours.  No results for input(s): DDIMER in the last 72 hours.  Cardiac Enzymes No results for input(s): CKMB, TROPONINI, MYOGLOBIN in the last 168 hours.  Invalid input(s): CK ------------------------------------------------------------------------------------------------------------------ Invalid input(s): [place]   #1. Acute delirium,  urinary tract infection or worsening dementia  Pt improved #2. Urinary tract infection, continue Rocephin and await urine culture #3. An episode of nausea and vomiting, supportive therapy, follow oral intake, continue antiemetics, IV fluids #4 elevated TSH. Started on synthroid #5. Mild dehydration as based on elevated BUN, follow labs, continue IV fluids #6. Hyperglycemia, hemoglobin A1c is 5.0 #7. Dementia questionable progressive       Code Status Orders        Start     Ordered   12/10/16 1941  Limited resuscitation (code)  Continuous    Question Answer Comment  In the event of cardiac or respiratory ARREST: Initiate Code Blue, Call Rapid Response Yes   In the event of cardiac or respiratory ARREST: Perform CPR No   In the event of cardiac or respiratory ARREST: Perform Intubation/Mechanical Ventilation No   In the event of cardiac or respiratory ARREST: Use NIPPV/BiPAp only if indicated Yes   In the event of cardiac or respiratory ARREST: Administer ACLS medications if indicated Yes   In the event of cardiac or respiratory ARREST: Perform Defibrillation or Cardioversion if indicated No      12/10/16 1942    Code Status History    Date Active Date Inactive Code Status Order ID Comments  User Context   12/10/2016  1:17 PM 12/10/2016  7:42 PM Full Code [id number]  [name], MD Inpatient   12/10/2016 12:21 PM 12/10/2016  1:17 PM Partial Code [id number] Discussed with patient's daughter, Mrs. Shoe today [name], MD ED           Consults  Psychiatry DVT Prophylaxis  Lovenox  Lab Results  Component Value Date   PLT 230 12/11/2016     Time Spent in minutes   22min  Greater than 50% of time spent in care coordination and counseling patient regarding the condition and plan of care.   Dustin Flock M.D on 12/12/2016 at 1:52 PM  Between 7am to 6pm - Pager - [phone]  After 6pm go to [email or web address] - password [email or web address] [place] [facility]   Office  [phone]

## 2016-12-13 DIAGNOSIS — R4182 Altered mental status, unspecified: Secondary | ICD-10-CM | POA: Diagnosis present

## 2016-12-13 LAB — GLUCOSE, CAPILLARY: Glucose-Capillary: 161 mg/dL — ABNORMAL HIGH (ref 65–99)

## 2016-12-13 NOTE — Progress Notes (Signed)
Blue, Red and Yellow arm bands in place

## 2016-12-13 NOTE — Clinical Social Work Note (Signed)
Clinical Social Work Assessment  Patient Details  Name: Suzanne Contreras MRN: [id number] Date of Birth: October 30, 1929  Date of referral:  12/13/16               Reason for consult:  Facility Placement                Permission sought to share information with:  Chartered certified accountant granted to share information::  No  Name::        Agency::     Relationship::     Contact Information:     Housing/Transportation Living arrangements for the past 2 months:  Single Family Home Source of Information:  Adult Children, Medical Team Patient Interpreter Needed:  None Criminal Activity/Legal Involvement Pertinent to Current Situation/Hospitalization:  No - Comment as needed Significant Relationships:  Adult Children Lives with:  Adult Children Do you feel safe going back to the place where you live?  Yes Need for family participation in patient care:  Yes (Comment) (Patient has dementia.)  Care giving concerns:  PT recommendation for STR   Social Worker assessment / plan: CSW attempted to meet with the patient and family at bedside. The patient was sleeping soundly and no family or visitors were available in person. The CSW contacted the patient's daughter, [name], to discuss discharge planning. The CSW explained that due to the patient's observation status, the patient would have to pay out of pocket for STR at a SNF. [name] reported that the patient and family would not be able to do so, and they are amenable to home health.  The CSW provided emotional support to [name] who is feeling caregiver burden and frustration with the social service system, especially in light of her mother not qualifying for Medicaid by $40. The CSW also provided the information for [name] to contact [employer] for support. The CSW explained that a Education officer, museum could be ordered for home health to assist with navigating the system. [name] thanked the CSW for assistance. The CSW also placed  the [employer] guide on the patient's chart for [name] to receive the next time she is at the hospital to visit with her mother.   Employment status:  Retired Forensic scientist:  Medicare PT Recommendations:  [place] / Referral to community resources:     Patient/Family's Response to care:  [name] thanked the CSW for care and emotional support.  Patient/Family's Understanding of and Emotional Response to Diagnosis, Current Treatment, and Prognosis:  [name] understands the insurance barrier and is willing to assist with her mother's care. She is frustrated with Medicare guidelines, but verbalized that she understands that the hospital is not at fault for the situation.  Emotional Assessment Appearance:  Appears stated age Attitude/Demeanor/Rapport:  Lethargic Affect (typically observed):   (Patient was sleeping) Orientation:  Oriented to Self (Patient is oriented to self at baseline.) Alcohol / Substance use:  Never Used Psych involvement (Current and /or in the community):  No (Comment)  Discharge Needs  Concerns to be addressed:  Care Coordination, Discharge Planning Concerns, Other (Comment Required (Caregiver support) Readmission within the last 30 days:  No Current discharge risk:  Chronically ill, Cognitively Impaired Barriers to Discharge:  Continued Medical Work up   [employer], LCSW 12/13/2016, 4:20 PM

## 2016-12-13 NOTE — Progress Notes (Signed)
[name] at [facility] [place]                                                                                                                                                                                  Patient Demographics   Suzanne Contreras, is a 83 y.o. female, DOB - 1930-05-10, WFU:[id number]  Admit date - 12/10/2016   Admitting Physician [name], MD  Outpatient Primary MD for the patient is Patient, No Pcp Per   LOS - 1  Subjective: Intermittent confusion due to her dementia      Review of Systems:   CONSTITUTIONAL:confusion improved  Vitals:   Vitals:   12/12/16 0633 12/12/16 1300 12/12/16 2042 12/13/16 0414  BP: 136/87 (!) 143/68 129/77 123/82  Pulse: (!) 112 90 (!) 105 73  Resp: 18 20  20   Temp: 98.4 F (36.9 C) 97.6 F (36.4 C) 98.3 F (36.8 C) 97.8 F (36.6 C)  TempSrc: Oral Axillary Oral Oral  SpO2: 100% 95% 97% 96%  Weight:      Height:        Wt Readings from Last 3 Encounters:  12/10/16 125 lb (56.7 kg)  07/14/16 160 lb (72.6 kg)     Intake/Output Summary (Last 24 hours) at 12/13/16 1146 Last data filed at 12/13/16 0300  Gross per 24 hour  Intake           1432.5 ml  Output                0 ml  Net           1432.5 ml    Physical Exam:   GENERAL: Chronically ill appearing confused  HEAD, EYES, EARS, NOSE AND THROAT: Atraumatic, normocephalic. Extraocular muscles are intact. Pupils equal and reactive to light. Sclerae anicteric. No conjunctival injection. No oro-pharyngeal erythema.  NECK: Supple. There is no jugular venous distention. No bruits, no lymphadenopathy, no thyromegaly.  HEART: Regular rate and rhythm,. No murmurs, no rubs, no clicks.  LUNGS: Clear to auscultation bilaterally. No rales or rhonchi. No wheezes.  ABDOMEN: Soft, flat, nontender, nondistended. Has good bowel sounds. No hepatosplenomegaly appreciated.  EXTREMITIES: No evidence of any cyanosis, clubbing, or peripheral edema.  +2 pedal and radial  pulses bilaterally.  NEUROLOGIC: Confused SKIN: Moist and warm with no rashes appreciated.  Psych: Not anxious, depressed LN: No inguinal LN enlargement    Antibiotics   Anti-infectives    Start     Dose/Rate Route Frequency Ordered Stop   12/11/16 1000  cefTRIAXone (ROCEPHIN) 1 g in dextrose 5 % 50 mL IVPB     1 g 100 mL/hr over 30 Minutes Intravenous Every 24 hours  12/10/16 1222     12/10/16 1130  cefTRIAXone (ROCEPHIN) 1 g in dextrose 5 % 50 mL IVPB     1 g 100 mL/hr over 30 Minutes Intravenous  Once 12/10/16 1117 12/11/16 1021      Medications   Scheduled Meds: . enoxaparin (LOVENOX) injection  40 mg Subcutaneous Q24H  . haloperidol  0.5 mg Oral TID  . levothyroxine  12.5 mcg Intravenous Daily  . traZODone  25 mg Oral QHS   Continuous Infusions: . cefTRIAXone (ROCEPHIN)  IV Stopped (12/12/16 1006)  . dextrose 5 % and 0.45 % NaCl with KCl 20 mEq/L 75 mL/hr at 12/12/16 2128   PRN Meds:.acetaminophen **OR** acetaminophen, haloperidol lactate, haloperidol lactate, ondansetron **OR** ondansetron (ZOFRAN) IV   Data Review:   Micro Results Recent Results (from the past 240 hour(s))  Urine culture     Status: Abnormal   Collection Time: 12/10/16  9:34 AM  Result Value Ref Range Status   Specimen Description URINE, CATHETERIZED  Final   Special Requests NONE  Final   Culture >=100,000 COLONIES/mL ESCHERICHIA COLI (A)  Final   Report Status 12/12/2016 FINAL  Final   Organism ID, Bacteria ESCHERICHIA COLI (A)  Final      Susceptibility   Escherichia coli - MIC*    AMPICILLIN 4 SENSITIVE Sensitive     CEFAZOLIN <=4 SENSITIVE Sensitive     CEFTRIAXONE <=1 SENSITIVE Sensitive     CIPROFLOXACIN <=0.25 SENSITIVE Sensitive     GENTAMICIN <=1 SENSITIVE Sensitive     IMIPENEM <=0.25 SENSITIVE Sensitive     NITROFURANTOIN <=16 SENSITIVE Sensitive     TRIMETH/SULFA <=20 SENSITIVE Sensitive     AMPICILLIN/SULBACTAM <=2 SENSITIVE Sensitive     PIP/TAZO <=4 SENSITIVE Sensitive      Extended ESBL NEGATIVE Sensitive     * >=100,000 COLONIES/mL ESCHERICHIA COLI    Radiology Reports Dg Chest Portable 1 View  Result Date: 12/10/2016 CLINICAL DATA:  Altered mental status, history of dementia, hallucinations EXAM: PORTABLE CHEST 1 VIEW COMPARISON:  None. FINDINGS: No active infiltrate or effusion is seen. Mediastinal and hilar contours are unremarkable. Minimal volume loss is present at the left lung base with slight elevation of the left hemidiaphragm. The heart is within upper limits of normal. No acute bony abnormality is seen. IMPRESSION: No definite active cardiopulmonary disease. Slight elevation of the left hemidiaphragm. Electronically Signed   By: Ivar Drape M.D.   On: 12/10/2016 10:00     CBC  Recent Labs Lab 12/10/16 0935 12/11/16 0337  WBC 7.8 7.8  HGB 11.5* 12.6  HCT 34.1* 37.6  PLT 251 230  MCV 92.5 93.1  MCH 31.3 31.1  MCHC 33.8 33.4  RDW 14.2 14.2  LYMPHSABS 1.6  --   MONOABS 0.4  --   EOSABS 0.1  --   BASOSABS 0.1  --     Chemistries   Recent Labs Lab 12/10/16 0935 12/11/16 0337  NA 140 141  K 3.9 4.0  CL 109 107  CO2 23 27  GLUCOSE 112* 127*  BUN 22* 13  CREATININE 0.75 0.69  CALCIUM 9.4 8.9  MG 1.7  --    ------------------------------------------------------------------------------------------------------------------ estimated creatinine clearance is 40.6 mL/min (by C-G formula based on SCr of 0.69 mg/dL). ------------------------------------------------------------------------------------------------------------------  Recent Labs  12/10/16 1359  HGBA1C 5.0   ------------------------------------------------------------------------------------------------------------------ No results for input(s): CHOL, HDL, LDLCALC, TRIG, CHOLHDL, LDLDIRECT in the last 72 hours. ------------------------------------------------------------------------------------------------------------------  Recent Labs  12/10/16 1359  TSH  10.883*   ------------------------------------------------------------------------------------------------------------------ No  results for input(s): VITAMINB12, FOLATE, FERRITIN, TIBC, IRON, RETICCTPCT in the last 72 hours.  Coagulation profile No results for input(s): INR, PROTIME in the last 168 hours.  No results for input(s): DDIMER in the last 72 hours.  Cardiac Enzymes No results for input(s): CKMB, TROPONINI, MYOGLOBIN in the last 168 hours.  Invalid input(s): CK ------------------------------------------------------------------------------------------------------------------ Invalid input(s): [place]   #1. Acute delirium,  urinary tract infection or worsening dementia  Patient waxing and waning #2. Urinary tract infection, continue Rocephin and await urine culture #3. An episode of nausea and vomiting, supportive therapy, follow oral intake, continue antiemetics, IV fluids #4 elevated TSH. Started on synthroid #5. Mild dehydration as based on elevated BUN, follow labs, stop IV fluids #6. Hyperglycemia, hemoglobin A1c is 5.0 #7. Dementia questionable progressive       Code Status Orders        Start     Ordered   12/10/16 1941  Limited resuscitation (code)  Continuous    Question Answer Comment  In the event of cardiac or respiratory ARREST: Initiate Code Blue, Call Rapid Response Yes   In the event of cardiac or respiratory ARREST: Perform CPR No   In the event of cardiac or respiratory ARREST: Perform Intubation/Mechanical Ventilation No   In the event of cardiac or respiratory ARREST: Use NIPPV/BiPAp only if indicated Yes   In the event of cardiac or respiratory ARREST: Administer ACLS medications if indicated Yes   In the event of cardiac or respiratory ARREST: Perform Defibrillation or Cardioversion if indicated No      12/10/16 1942    Code Status History    Date Active Date Inactive Code Status Order ID Comments User Context    12/10/2016  1:17 PM 12/10/2016  7:42 PM Full Code [id number]  [name], MD Inpatient   12/10/2016 12:21 PM 12/10/2016  1:17 PM Partial Code [id number] Discussed with patient's daughter, Mrs. Shoe today [name], MD ED           Consults  Psychiatry DVT Prophylaxis  Lovenox  Lab Results  Component Value Date   PLT 230 12/11/2016     Time Spent in minutes  20min  Greater than 50% of time spent in care coordination and counseling patient regarding the condition and plan of care.   Dustin Flock M.D on 12/13/2016 at 11:46 AM  Between 7am to 6pm - Pager - [phone]  After 6pm go to [email or web address] - password [email or web address] [place]   Office  [phone]

## 2016-12-13 NOTE — Care Management Obs Status (Signed)
[place] NOTIFICATION   Patient Details  Name: Suzanne Contreras MRN: [id number] Date of Birth: [birth year]   Medicare Observation Status Notification Given:  Yes    Lindon Kiel A, RN 12/13/2016, 2:25 PM

## 2016-12-13 NOTE — Care Management CC44 (Addendum)
Condition Code 44 Documentation Completed  Patient Details  Name: Suzanne Contreras MRN: [id number] Date of Birth: Jan 12, 1930   Condition Code 44 given:  Yes Patient signature on Condition Code 44 notice:  Yes Documentation of 2 MD's agreement:  Yes Code 44 added to claim:  Yes Left Voice phone message for daughter.   Breeley Bischof A, RN 12/13/2016, 2:26 PM

## 2016-12-13 NOTE — Clinical Social Work Note (Signed)
CSW attempted to contact the patient's daughter [name] to discuss discharge planning with no success and no ability to leave a voice mail. The patient is not alert and oriented, and no family is at bedside to discuss. The patient has traditional Medicare which would not cover SNF placement due to observation status and no qualifying hospitalization in the past 30 days. CSW will continue to follow to address the barrier with the family.  Santiago Bumpers, MSW, Latanya Presser [phone]

## 2016-12-14 LAB — GLUCOSE, CAPILLARY: Glucose-Capillary: 98 mg/dL (ref 65–99)

## 2016-12-14 MED ORDER — LEVOTHYROXINE SODIUM 25 MCG PO TABS: 25.0000 ug | ORAL_TABLET | Freq: Every day | ORAL | Status: DC

## 2016-12-14 MED ORDER — HALOPERIDOL 0.5 MG PO TABS: 0.5000 mg | ORAL_TABLET | Freq: Three times a day (TID) | ORAL | 0 refills | Status: DC

## 2016-12-14 MED ORDER — TRAZODONE HCL 50 MG PO TABS: 25.0000 mg | ORAL_TABLET | Freq: Every day | ORAL | 0 refills | Status: DC

## 2016-12-14 MED ORDER — LEVOTHYROXINE SODIUM 25 MCG PO TABS: 25.0000 ug | ORAL_TABLET | Freq: Every day | ORAL | 0 refills | Status: DC

## 2016-12-14 MED ORDER — CEPHALEXIN 500 MG PO CAPS: 500.0000 mg | ORAL_CAPSULE | Freq: Two times a day (BID) | ORAL | Status: DC

## 2016-12-14 MED ORDER — CEPHALEXIN 500 MG PO CAPS: 500.0000 mg | ORAL_CAPSULE | Freq: Two times a day (BID) | ORAL | 0 refills | Status: AC

## 2016-12-14 NOTE — Discharge Summary (Signed)
[place] [facility] [place] NAME: Kaidan Spengler    MR#:  [id number]  DATE OF BIRTH:  September 09, 1929  DATE OF ADMISSION:  12/10/2016 ADMITTING PHYSICIAN: [name], MD  DATE OF DISCHARGE: 12/14/2016   PRIMARY CARE PHYSICIAN: Patient, No Pcp Per    ADMISSION DIAGNOSIS:  Delirium [R41.0] Cystitis [N30.90]  DISCHARGE DIAGNOSIS:  Principal Problem:   Acute delirium Active Problems:   Hypothyroidism due to acquired atrophy of thyroid   Late onset Alzheimer's disease with behavioral disturbance   Vaginal lump   UTI (urinary tract infection)   Confusion   Altered mental status   SECONDARY DIAGNOSIS:   Past Medical History:  Diagnosis Date  . Dementia   . Diabetes mellitus without complication [facility])     HOSPITAL COURSE:   #1. Acute delirium,  urinary tract infection or worsening dementia  Patient waxing and waning #2. Urinary tract infection, continue Rocephin and followed urine culture, oral keflex on d/c #3. An episode of nausea and vomiting, supportive therapy, follow oral intake, continue antiemetics, IV fluids, improved. #4 elevated TSH. Started on synthroid. #5. Mild dehydration as based on elevated BUN, follow labs, stop IV fluids #6. Hyperglycemia, hemoglobin A1c is 5.0 #7. Dementia questionable progressive.  PT suggested for SNF, but her insurance denies for that. D/c home with [facility], PT, RN- daughter takes care of her.  DISCHARGE CONDITIONS:   Stable.  CONSULTS OBTAINED:  Treatment Team:  Hildred Priest, MD  DRUG ALLERGIES:   Allergies  Allergen Reactions  . Penicillins     DISCHARGE MEDICATIONS:   Current Discharge Medication List    START taking these medications   Details  cephALEXin (KEFLEX) 500 MG capsule Take 1 capsule (500 mg total) by mouth every 12 (twelve) hours. Qty: 8 capsule, Refills: 0    haloperidol (HALDOL) 0.5 MG tablet Take 1 tablet (0.5 mg total) by mouth 3 (three) times  daily. Qty: 30 tablet, Refills: 0    levothyroxine (SYNTHROID, LEVOTHROID) 25 MCG tablet Take 1 tablet (25 mcg total) by mouth daily before breakfast. Qty: 30 tablet, Refills: 0    traZODone (DESYREL) 50 MG tablet Take 0.5 tablets (25 mg total) by mouth at bedtime. Qty: 30 tablet, Refills: 0      CONTINUE these medications which have NOT CHANGED   Details  naproxen sodium (ANAPROX) 220 MG tablet Take 220 mg by mouth 2 (two) times daily with a meal.         DISCHARGE INSTRUCTIONS:    Follow with PMD in 1-2 weeks.  If you experience worsening of your admission symptoms, develop shortness of breath, life threatening emergency, suicidal or homicidal thoughts you must seek medical attention immediately by calling 911 or calling your MD immediately  if symptoms less severe.  You Must read complete instructions/literature along with all the possible adverse reactions/side effects for all the Medicines you take and that have been prescribed to you. Take any new Medicines after you have completely understood and accept all the possible adverse reactions/side effects.   Please note  You were cared for by a hospitalist during your hospital stay. If you have any questions about your discharge medications or the care you received while you were in the hospital after you are discharged, you can call the unit and asked to speak with the hospitalist on call if the hospitalist that took care of you is not available. Once you are discharged, your primary care physician will handle any further medical issues.  Please note that NO REFILLS for any discharge medications will be authorized once you are discharged, as it is imperative that you return to your primary care physician (or establish a relationship with a primary care physician if you do not have one) for your aftercare needs so that they can reassess your need for medications and monitor your lab values.    Today   CHIEF COMPLAINT:   Chief  Complaint  Patient presents with  . Altered Mental Status    HISTORY OF PRESENT ILLNESS:  Naraya Stoneberg  is a 83 y.o. female with a known history of Alzheimer's dementia, who presents to the hospital with complaints of poor oral intake, and the episode of nausea and vomiting at 2 days ago, being more restless, confused, trying to leave the house, trying to hit family members. On arrival to emergency room, she was noted to have pyuria, and significant delirium requiring multiple doses of medications. Hospitalist services were contacted for admission   VITAL SIGNS:  Blood pressure (!) 118/59, pulse 72, temperature 98.7 F (37.1 C), temperature source Oral, resp. rate 16, height 5' (1.524 m), weight 56.7 kg (125 lb), SpO2 98 %.  I/O:   Intake/Output Summary (Last 24 hours) at 12/14/16 1652 Last data filed at 12/14/16 0939  Gross per 24 hour  Intake              360 ml  Output                0 ml  Net              360 ml    PHYSICAL EXAMINATION:   GENERAL: Chronically ill appearing confused  HEAD, EYES, EARS, NOSE AND THROAT: Atraumatic, normocephalic. Extraocular muscles are intact. Pupils equal and reactive to light. Sclerae anicteric. No conjunctival injection. No oro-pharyngeal erythema.  NECK: Supple. There is no jugular venous distention. No bruits, no lymphadenopathy, no thyromegaly.  HEART: Regular rate and rhythm,. No murmurs, no rubs, no clicks.  LUNGS: Clear to auscultation bilaterally. No rales or rhonchi. No wheezes.  ABDOMEN: Soft, flat, nontender, nondistended. Has good bowel sounds. No hepatosplenomegaly appreciated.  EXTREMITIES: No evidence of any cyanosis, clubbing, or peripheral edema.  +2 pedal and radial pulses bilaterally.  NEUROLOGIC: Confused SKIN: Moist and warm with no rashes appreciated.  Psych: Not anxious, depressed LN: No inguinal LN enlargement  DATA REVIEW:   CBC  Recent Labs Lab 12/11/16 0337  WBC 7.8  HGB 12.6  HCT 37.6  PLT 230     Chemistries   Recent Labs Lab 12/10/16 0935 12/11/16 0337  NA 140 141  K 3.9 4.0  CL 109 107  CO2 23 27  GLUCOSE 112* 127*  BUN 22* 13  CREATININE 0.75 0.69  CALCIUM 9.4 8.9  MG 1.7  --     Cardiac Enzymes No results for input(s): TROPONINI in the last 168 hours.  Microbiology Results  Results for orders placed or performed during the hospital encounter of 12/10/16  Urine culture     Status: Abnormal   Collection Time: 12/10/16  9:34 AM  Result Value Ref Range Status   Specimen Description URINE, CATHETERIZED  Final   Special Requests NONE  Final   Culture >=100,000 COLONIES/mL ESCHERICHIA COLI (A)  Final   Report Status 12/12/2016 FINAL  Final   Organism ID, Bacteria ESCHERICHIA COLI (A)  Final      Susceptibility   Escherichia coli - MIC*    AMPICILLIN 4 SENSITIVE Sensitive  CEFAZOLIN <=4 SENSITIVE Sensitive     CEFTRIAXONE <=1 SENSITIVE Sensitive     CIPROFLOXACIN <=0.25 SENSITIVE Sensitive     GENTAMICIN <=1 SENSITIVE Sensitive     IMIPENEM <=0.25 SENSITIVE Sensitive     NITROFURANTOIN <=16 SENSITIVE Sensitive     TRIMETH/SULFA <=20 SENSITIVE Sensitive     AMPICILLIN/SULBACTAM <=2 SENSITIVE Sensitive     PIP/TAZO <=4 SENSITIVE Sensitive     Extended ESBL NEGATIVE Sensitive     * >=100,000 COLONIES/mL ESCHERICHIA COLI    RADIOLOGY:  No results found.  EKG:  No orders found for this or any previous visit.    Management plans discussed with the patient, family and they are in agreement.  CODE STATUS:     Code Status Orders        Start     Ordered   12/10/16 1941  Limited resuscitation (code)  Continuous    Question Answer Comment  In the event of cardiac or respiratory ARREST: Initiate Code Blue, Call Rapid Response Yes   In the event of cardiac or respiratory ARREST: Perform CPR No   In the event of cardiac or respiratory ARREST: Perform Intubation/Mechanical Ventilation No   In the event of cardiac or respiratory ARREST: Use  NIPPV/BiPAp only if indicated Yes   In the event of cardiac or respiratory ARREST: Administer ACLS medications if indicated Yes   In the event of cardiac or respiratory ARREST: Perform Defibrillation or Cardioversion if indicated No      12/10/16 1942    Code Status History    Date Active Date Inactive Code Status Order ID Comments User Context   12/10/2016  1:17 PM 12/10/2016  7:42 PM Full Code [id number]  [name], MD Inpatient   12/10/2016 12:21 PM 12/10/2016  1:17 PM Partial Code [id number] Discussed with patient's daughter, Mrs. Shoe today [name], MD ED      TOTAL TIME TAKING CARE OF THIS PATIENT: 35 minutes.    Vaughan Basta M.D on 12/14/2016 at 4:52 PM  Between 7am to 6pm - Pager - [phone]  After 6pm go to [email or web address] - password [email or web address] [facility]  Office  [phone]  CC: Primary care physician; Patient, No Pcp Per   Note: This dictation was prepared with Dragon dictation along with smaller phrase technology. Any transcriptional errors that result from this process are unintentional.

## 2016-12-14 NOTE — Care Management (Signed)
Discharge to home today per Dr. Anselm Jungling. Discussed home health agencies with daughter, [name]. [facility]. Will update Floydene Flock, [facility] representative. Daughter will transport Shelbie Ammons RN MSN CCM Care Management [phone]

## 2016-12-14 NOTE — Clinical Social Work Note (Signed)
Pt is ready for discharge today and will return home with daughter. RNCM following for discharge planning needs. CSW is signing off as no further needs identified.   Darden Dates, MSW, LCSW  Clinical Social Worker  [phone]

## 2016-12-18 ENCOUNTER — Encounter: Payer: Self-pay | Admitting: Intensive Care

## 2016-12-18 ENCOUNTER — Emergency Department
Admission: EM | Admit: 2016-12-18 | Discharge: 2016-12-20 | Disposition: A | Discharge status: Home / Self Care | Payer: Medicare Other | Attending: Emergency Medicine | Admitting: Emergency Medicine

## 2016-12-18 DIAGNOSIS — Z79899 Other long term (current) drug therapy: Secondary | ICD-10-CM | POA: Insufficient documentation

## 2016-12-18 DIAGNOSIS — R4182 Altered mental status, unspecified: Secondary | ICD-10-CM | POA: Diagnosis present

## 2016-12-18 DIAGNOSIS — E039 Hypothyroidism, unspecified: Secondary | ICD-10-CM | POA: Diagnosis not present

## 2016-12-18 DIAGNOSIS — E119 Type 2 diabetes mellitus without complications: Secondary | ICD-10-CM | POA: Diagnosis not present

## 2016-12-18 DIAGNOSIS — F0391 Unspecified dementia with behavioral disturbance: Secondary | ICD-10-CM | POA: Insufficient documentation

## 2016-12-18 HISTORY — DX: Unspecified malignant neoplasm of skin, unspecified: C44.90

## 2016-12-18 LAB — CBC WITH DIFFERENTIAL/PLATELET
Basophils Absolute: 0.1 10*3/uL (ref 0–0.1)
Basophils Relative: 1 %
EOS ABS: 0.1 10*3/uL (ref 0–0.7)
EOS PCT: 1 %
HCT: 33.8 % — ABNORMAL LOW (ref 35.0–47.0)
HEMOGLOBIN: 11.6 g/dL — AB (ref 12.0–16.0)
LYMPHS ABS: 1.6 10*3/uL (ref 1.0–3.6)
Lymphocytes Relative: 17 %
MCH: 32.2 pg (ref 26.0–34.0)
MCHC: 34.4 g/dL (ref 32.0–36.0)
MCV: 93.4 fL (ref 80.0–100.0)
MONOS PCT: 11 %
Monocytes Absolute: 1 10*3/uL — ABNORMAL HIGH (ref 0.2–0.9)
NEUTROS PCT: 70 %
Neutro Abs: 6.8 10*3/uL — ABNORMAL HIGH (ref 1.4–6.5)
Platelets: 312 10*3/uL (ref 150–440)
RBC: 3.62 MIL/uL — ABNORMAL LOW (ref 3.80–5.20)
RDW: 13.7 % (ref 11.5–14.5)
WBC: 9.6 10*3/uL (ref 3.6–11.0)

## 2016-12-18 LAB — COMPREHENSIVE METABOLIC PANEL
ALK PHOS: 67 U/L (ref 38–126)
ALT: 18 U/L (ref 14–54)
AST: 28 U/L (ref 15–41)
Albumin: 3.9 g/dL (ref 3.5–5.0)
Anion gap: 9 (ref 5–15)
BILIRUBIN TOTAL: 0.4 mg/dL (ref 0.3–1.2)
BUN: 23 mg/dL — AB (ref 6–20)
CO2: 26 mmol/L (ref 22–32)
CREATININE: 0.57 mg/dL (ref 0.44–1.00)
Calcium: 9.2 mg/dL (ref 8.9–10.3)
Chloride: 103 mmol/L (ref 101–111)
GFR calc Af Amer: >60 mL/min (ref >60)
GLUCOSE: 104 mg/dL — AB (ref 65–99)
Potassium: 4 mmol/L (ref 3.5–5.1)
Sodium: 138 mmol/L (ref 135–145)
TOTAL PROTEIN: 6.8 g/dL (ref 6.5–8.1)

## 2016-12-18 MED ORDER — LORAZEPAM 1 MG PO TABS
1.0000 mg | ORAL_TABLET | Freq: Once | ORAL | Status: AC
  Administered 2016-12-18: 1 mg via ORAL
  Filled 2016-12-18: qty 1

## 2016-12-18 NOTE — ED Triage Notes (Signed)
Daughter brought patient to ER due to dementia. Daughter stated "she has gotten out of control at home, will not sleep at night, will not eat or drink, and I cannot take care of her anymore" Daughter reports patient was just in ER recently for UTI and has been taking antibiotics at home. Patient has advanced dementia.

## 2016-12-18 NOTE — ED Notes (Signed)
Bed alarm in place and floor pads placed for pt safety

## 2016-12-18 NOTE — ED Notes (Signed)
Bladder scan =276 ml. Bladder scan repeated x3 with same equivalent.

## 2016-12-19 LAB — URINALYSIS, COMPLETE (UACMP) WITH MICROSCOPIC
BILIRUBIN URINE: NEGATIVE
Bacteria, UA: NONE SEEN
GLUCOSE, UA: NEGATIVE mg/dL
Hgb urine dipstick: NEGATIVE
KETONES UR: NEGATIVE mg/dL
LEUKOCYTES UA: NEGATIVE
Nitrite: NEGATIVE
PROTEIN: NEGATIVE mg/dL
Specific Gravity, Urine: 1.01 (ref 1.005–1.030)
pH: 6 (ref 5.0–8.0)

## 2016-12-19 LAB — TSH: TSH: 3.845 u[IU]/mL (ref 0.350–4.500)

## 2016-12-19 MED ORDER — HALOPERIDOL 0.5 MG PO TABS
0.5000 mg | ORAL_TABLET | Freq: Once | ORAL | Status: DC
  Filled 2016-12-19: qty 1

## 2016-12-19 MED ORDER — TRAZODONE HCL 50 MG PO TABS
50.0000 mg | ORAL_TABLET | Freq: Every day | ORAL | Status: DC
  Administered 2016-12-19: 50 mg via ORAL
  Filled 2016-12-19 (×2): qty 1

## 2016-12-19 MED ORDER — HALOPERIDOL LACTATE 5 MG/ML IJ SOLN
2.0000 mg | Freq: Once | INTRAMUSCULAR | Status: AC
  Administered 2016-12-19: 2 mg via INTRAMUSCULAR
  Filled 2016-12-19: qty 1

## 2016-12-19 NOTE — ED Notes (Signed)
Daughter in to visit. Will not take foods for lunch. Does take small amounts juice for na.

## 2016-12-19 NOTE — ED Notes (Signed)
Per [place] called to Ilse Billman, [phone], guardian and son of pt, message left, attempting to get permission for IO cath to r/o UTI on voluntary incontinent pt

## 2016-12-19 NOTE — ED Provider Notes (Signed)
[facility] Emergency Department Provider Note    ____________________________________________   I have reviewed the triage vital signs and the nursing notes.   HISTORY  Chief Complaint Altered Mental Status   History limited by: Dementia, history obtained from daughter   HPI Suzanne Contreras is a 83 y.o. female who presents to the emergency department today because of concerns for increasing agitation and inability care for her at home. The patient recently was seen in emergency department and admitted for urinary tract infection behavior issues. She is discharged roughly 4 days ago. Since that time the daughter states that she's been having increasing hard time controlling her. States that the patient has not been sleeping and stays up at night. The daughter has tried the medications prescribed without any effect. The daughter has not discussed with the primary care physician since discharge any placement issues.  Past Medical History:  Diagnosis Date  . Dementia   . Diabetes mellitus without complication ([place])   . Skin cancer     Patient Active Problem List   Diagnosis Date Noted  . Altered mental status 12/13/2016  . Confusion 12/12/2016  . Acute delirium 12/10/2016  . UTI (urinary tract infection) 12/10/2016  . Vaginal lump 10/22/2016  . Hypothyroidism due to acquired atrophy of thyroid 02/20/2016  . Late onset Alzheimer's disease with behavioral disturbance 11/21/2015    History reviewed. No pertinent surgical history.  Prior to Admission medications   Medication Sig Start Date End Date Taking? Authorizing Provider  haloperidol (HALDOL) 0.5 MG tablet Take 1 tablet (0.5 mg total) by mouth 3 (three) times daily. 12/14/16  Yes Vaughan Basta, MD  levothyroxine (SYNTHROID, LEVOTHROID) 25 MCG tablet Take 1 tablet (25 mcg total) by mouth daily before breakfast. 12/15/16  Yes Vaughan Basta, MD  naproxen sodium (ANAPROX) 220 MG tablet  Take 220 mg by mouth 2 (two) times daily with a meal.   Yes [provider]  traZODone (DESYREL) 50 MG tablet Take 0.5 tablets (25 mg total) by mouth at bedtime. 12/14/16  Yes Vaughan Basta, MD    Allergies Penicillins  History reviewed. No pertinent family history.  Social History Social History  Substance Use Topics  . Smoking status: Never Smoker  . Smokeless tobacco: Never Used  . Alcohol use No    Review of Systems Unable to obtain from patient given dementia.  ____________________________________________   PHYSICAL EXAM:  VITAL SIGNS: ED Triage Vitals [12/18/16 1846]  Enc Vitals Group     BP (!) 144/75     Pulse Rate 99     Resp 16     Temp 98.9 F (37.2 C)     Temp Source Oral     SpO2 100 %     Weight 135 lb (61.2 kg)     Height 5' (1.524 m)     Head Circumference      Peak Flow      Pain Score      Pain Loc      Pain Edu?      Excl. in [place]?      Constitutional: Awake and alert, not oriented.  Eyes: Conjunctivae are normal.  ENT   Head: Normocephalic and atraumatic.   Nose: No congestion/rhinnorhea.   Mouth/Throat: Mucous membranes are moist.   Neck: No stridor. Hematological/Lymphatic/Immunilogical: No cervical lymphadenopathy. Cardiovascular: Normal rate, regular rhythm.  No murmurs, rubs, or gallops.  Respiratory: Normal respiratory effort without tachypnea nor retractions. Breath sounds are clear and equal bilaterally. No wheezes/rales/rhonchi.  Gastrointestinal: Soft and non tender. No rebound. No guarding.  Genitourinary: Deferred Musculoskeletal: Normal range of motion in all extremities. No lower extremity edema. Neurologic:  Awake and alert. Not oriented. Moving all extremities.  Skin:  Skin is warm, dry and intact. No rash noted. Psychiatric: Agitated.  ____________________________________________    LABS (pertinent positives/negatives)  Labs Reviewed  CBC WITH DIFFERENTIAL/PLATELET - Abnormal; Notable  for the following:       Result Value   RBC 3.62 (*)    Hemoglobin 11.6 (*)    HCT 33.8 (*)    Neutro Abs 6.8 (*)    Monocytes Absolute 1.0 (*)    All other components within normal limits  COMPREHENSIVE METABOLIC PANEL - Abnormal; Notable for the following:    Glucose, Bld 104 (*)    BUN 23 (*)    All other components within normal limits  URINALYSIS, COMPLETE (UACMP) WITH MICROSCOPIC     ____________________________________________   EKG  None  ____________________________________________    RADIOLOGY  None  ____________________________________________   PROCEDURES  Procedures  ____________________________________________   INITIAL IMPRESSION / ASSESSMENT AND PLAN / ED COURSE  Pertinent labs & imaging results that were available during my care of the patient were reviewed by me and considered in my medical decision making (see chart for details).  Patient brought in by daughter because of concerns for inability to take care of her at home. He is somewhat agitated here in the emergency department. Did try some Ativan. This did not calm the patient down. We will have psychiatry evaluate.  ____________________________________________   FINAL CLINICAL IMPRESSION(S) / ED DIAGNOSES  Agitation  Note: This dictation was prepared with Dragon dictation. Any transcriptional errors that result from this process are unintentional     Nance Pear, MD 12/19/16 [phone]

## 2016-12-19 NOTE — ED Notes (Signed)
BEHAVIORAL HEALTH ROUNDING Patient sleeping: No. Patient alert and oriented: no Behavior appropriate: Yes.  ; If no, describe:  Nutrition and fluids offered: Yes  Toileting and hygiene offered: Yes  Sitter present: yes Law enforcement present: Yes  

## 2016-12-19 NOTE — ED Notes (Signed)
Verified permission for IO cath on pt heard by Eliezer Lofts, RN

## 2016-12-19 NOTE — ED Notes (Signed)
[facility] doctor called for and received an extensive report from this RN. [facility] computer is set up in the room with the patient.

## 2016-12-19 NOTE — ED Provider Notes (Signed)
Social work here to see patient and discuss with family at this time for disposition planning.      Delman Kitten, MD 12/19/16 [phone]

## 2016-12-19 NOTE — ED Notes (Signed)
BEHAVIORAL HEALTH ROUNDING Patient sleeping: Yes.   Patient alert and oriented: not applicable SLEEPING Behavior appropriate: Yes.  ; If no, describe: SLEEPING Nutrition and fluids offered: No SLEEPING Toileting and hygiene offered: NoSLEEPING Sitter present: safety sitter at bedside plus Q 15 min safety rounds and observation. Law enforcement present: Yes ODS

## 2016-12-19 NOTE — ED Notes (Signed)
ENVIRONMENTAL ASSESSMENT  Potentially harmful objects out of patient reach: Yes.  Personal belongings secured: Yes.  Patient dressed in hospital provided attire only: Yes.  Plastic bags out of patient reach: Yes.  Patient care equipment (cords, cables, call bells, lines, and drains) shortened, removed, or accounted for: Yes.  Equipment and supplies removed from bottom of stretcher: Yes.  Potentially toxic materials out of patient reach: Yes.  Sharps container removed or out of patient reach: Yes.   BEHAVIORAL HEALTH ROUNDING  Patient sleeping: No.  Patient alert and oriented: alert but confused Behavior appropriate: Yes. ; If no, describe:  Nutrition and fluids offered: Yes  Toileting and hygiene offered: Yes  Sitter present: safety sitter at bedside plus Q 15 min safety rounds and observation.  Law enforcement present: Yes ODS  ED [place]  Is the patient under IVC or is there intent for IVC: no.  Is the patient medically cleared: Yes.  Is there vacancy in the ED BHU: Yes.  Is the population mix appropriate for patient: No.  Is the patient awaiting placement in inpatient or outpatient setting: Yes.  Has the patient had a psychiatric consult: Yes.  Survey of unit performed for contraband, proper placement and condition of furniture, tampering with fixtures in bathroom, shower, and each patient room: Yes. ; Findings: All clear  APPEARANCE/BEHAVIOR  calm, cooperative at this time NEURO ASSESSMENT  Orientation: hx of dementia, pt at her baseline.   Hallucinations: No.None noted (Hallucinations)  Speech: Normal  Gait:unsteady RESPIRATORY ASSESSMENT  WNL  CARDIOVASCULAR ASSESSMENT  WNL  GASTROINTESTINAL ASSESSMENT  WNL  EXTREMITIES  WNL  PLAN OF CARE  Provide calm/safe environment. Vital signs assessed TID. ED BHU Assessment once each 12-hour shift. Collaborate with TTS daily or as condition indicates. Assure the ED provider has rounded once each shift.  Provide and encourage hygiene. Provide redirection as needed. Assess for escalating behavior; address immediately and inform ED provider.  Assess family dynamic and appropriateness for visitation as needed: Yes. ; If necessary, describe findings:  Educate the patient/family about BHU procedures/visitation: Yes. ; If necessary, describe findings: Pt is calm at this time.  Will continue to monitor with Q 15 min safety rounds and observation plus safety sitter at bedside at this time.

## 2016-12-19 NOTE — ED Notes (Signed)

## 2016-12-19 NOTE — ED Notes (Signed)
BEHAVIORAL HEALTH ROUNDING  Patient sleeping: No.  Patient alert and oriented: yes to her baseline Behavior appropriate: Yes. ; If no, describe:  Nutrition and fluids offered: Yes  Toileting and hygiene offered: Yes  Sitter present: not applicable, Q 15 min safety rounds and observation.  Law enforcement present: Yes ODS  

## 2016-12-19 NOTE — ED Notes (Signed)
Pt's daughter stated that she needed to go home because she has medications for herself that are past due. Daughter [name] provided 2 numbers for contact. [phone] and [phone].

## 2016-12-19 NOTE — Clinical Social Work Note (Signed)
CSW consulted for placement options. Pt was discharged on 12/14/2016 and returned home with home health services. Pt does not have a 3 night inpatient qualifying stay in the last 30 days. Pt returned to the ED overnight. CSW spoke with pt's daughter and gave information on placement options at the SNF, ALF, ALF with Memory Care level of care as well as return home with caregivers. All of which are private pay only. Pt's son manages pt's money and does not qualify for Medicaid due to income. Pt's daughter shared that the family spoke with [employer]. CSW left a message for case manager requesting a return phone call. CSW also reached out to pt's son as recommended by pt's daughter.   Pt has a history of dementia and lives home with her daughter, Suzanne Contreras, who lives locally. Pt's son, Suzanne Contreras, is legal guardian, however he lives in [place] and is available by phone. CSW attempted to reach pt's son, however spoke to his wife. CSW explained the process of placement as private pay due to lack of income. Per daughter in law, finances are limited and pt needs more support than family is able to provide. CSW encouraged for pt's daughter and son speak to discuss options.   CSW then updated pt's daughter and provided resources for placement and private duty sitters as well as reaching out to DSS for Medicaid assistance.   Contact numbers: Suzanne Contreras (daughter):  [phone] and [phone] Suzanne Contreras (son and guardian):  [phone] and [phone]  CSW updated MD. CSW will continue to follow.   Darden Dates, MSW, LCSW  Clinical Social Worker  [phone]

## 2016-12-19 NOTE — ED Notes (Signed)
BEHAVIORAL HEALTH ROUNDING Patient sleeping: Yes.   Patient alert and oriented: not applicable Behavior appropriate: No.; If no, describe:  Nutrition and fluids offered: No Toileting and hygiene offered: No Sitter present: yes Law enforcement present: Yes

## 2016-12-19 NOTE — ED Provider Notes (Signed)
-----------------------------------------   6:57 AM on 12/19/2016 -----------------------------------------   Blood pressure 139/81, pulse (!) 101, temperature 97.7 F (36.5 C), temperature source Axillary, resp. rate 16, height 5' (1.524 m), weight 61.2 kg (135 lb), SpO2 97 %.  The patient had no acute events since last update.  Calm and cooperative at this time.    The patient was seen by the specialist on-call and the recommendation was to admit her until her dementia was stabilized and then to transfer her to a long-term facility. The patient was admitted to the hospital on 7/26 for cystitis and at this time she does not have a medical need for admission. The patient has been more confused but may need a social work consult. I will have social worker evaluate the patient.     Loney Hering, MD 12/19/16 [phone]

## 2016-12-19 NOTE — ED Notes (Signed)
Patient figity in bed. Keeps eyes closed most of time but can answer questions. Able to take small amounts of po meds with na assistance.

## 2016-12-19 NOTE — ED Notes (Signed)
Asleep with NA at side.

## 2016-12-19 NOTE — ED Notes (Signed)
Patient resting. Confused, takes fluids with assist. Continues with Air cabin crew.

## 2016-12-19 NOTE — ED Notes (Signed)
Pt urinated in bed and on clothes. Pt changed into blue scrubs and put back into bed. Pt is agitated at this time and trying to grab nurses arms.

## 2016-12-19 NOTE — ED Notes (Signed)
BEHAVIORAL HEALTH ROUNDING  Patient sleeping: No.  Patient alert and oriented: alert but confused Behavior appropriate: Yes. ; If no, describe:  Nutrition and fluids offered: Yes  Toileting and hygiene offered: Yes  Sitter present: safety sitter at bedside plus Q 15 min safety rounds and observation.  Law enforcement present: Yes ODS

## 2016-12-19 NOTE — ED Notes (Signed)
BEHAVIORAL HEALTH ROUNDING Patient sleeping: Yes.   Patient alert and oriented: yes Behavior appropriate: Yes.  ; If no, describe:  Nutrition and fluids offered: No Toileting and hygiene offered: No Sitter present: yes Law enforcement present: Yes  

## 2016-12-19 NOTE — ED Notes (Signed)
Pharmacy called to inquire about 0.5 mg Haldol, spoke with Waunita Schooner, said he is sending that up soon.

## 2016-12-19 NOTE — ED Notes (Signed)
BEHAVIORAL HEALTH ROUNDING Patient sleeping: Yes.   Patient alert and oriented: yes Behavior appropriate:No ; If no, describe: confused Nutrition and fluids offered: Yes  Toileting and hygiene offered: Yes  Sitter present: yes Law enforcement present: Yes

## 2016-12-20 NOTE — Progress Notes (Signed)
CSW received phone call from [name], Patient's daughter, asking if she could come and pick Patient up. CSW asked that Mrs. Shoe give her brother a call regarding coordinating discharge plan as per brother and legal guardian, Earnest Thalman, he is coming in from [place], [place] on tomorrow and will be here on Tuesday at the latest to pick his mother up. Mrs. Shoe expressed frustration that when she called for an update on her mother, a password was required. CSW explained the process for a patient having a legal guardian and that due to HIPAA, legally the legal guardian makes all decisions regarding Patient. CSW reported that per Mr. Foye, he has been trying to get in contact with Mrs. Shoe and CSW again encouraged Mrs. Shoe to contact her brother to coordinate discharge planning. Mrs. Shoe apologized to this CSW for "exploding on you but I've been providing my mother with care all of this time and her having a legal guardian has never been a problem before". CSW expressed understanding and provided brief emotional support. CSW encouraged Mrs. Shoe to have her brother, Zarianna Dicarlo,  give this CSW a phone call should discharge plan change.   CSW contacted Mr. Langan to inquire about whether or not his sister, [name], could come and pick Patient up and take her home. Mr. Veloso [phone]) reports that at this time, Patient is to be discharged into his care when he arrives on Tuesday from [place], [place] unless he says otherwise. CSW has updated Patient's RN.     Lorrine Kin, MSW, [place] Clinical Social Worker [phone]

## 2016-12-20 NOTE — ED Notes (Signed)
BEHAVIORAL HEALTH ROUNDING Patient sleeping: Yes.   Patient alert and oriented: not applicable SLEEPING Behavior appropriate: Yes.  ; If no, describe: SLEEPING Nutrition and fluids offered: No SLEEPING Toileting and hygiene offered: NoSLEEPING Sitter present: safety sitter at bedside and Q 15 min safety rounds and observation. Law enforcement present: Yes ODS

## 2016-12-20 NOTE — ED Notes (Signed)
Son, Fruma Africa [phone], called for update on mother. Password given to son [phone]). Able to come Tuesday to pick her up.

## 2016-12-20 NOTE — ED Notes (Signed)
Verified with son that patient could be released to [employer], verbalized over telephone.

## 2016-12-20 NOTE — ED Provider Notes (Signed)
-----------------------------------------   11:30 AM on 12/20/2016 -----------------------------------------  Family has elected to take patient home. She does not appear to be in any imminent danger of harm to self or others. They would prefer not to keep her in the hospital and I think certainly better for the patient she goes home where she has been living. We will therefore discharge her with return precautions and follow-up given and understood   Schuyler Amor, MD 12/20/16 1130

## 2016-12-20 NOTE — Progress Notes (Signed)
CSW received phone call from Con Memos, Patient's son and legal guardian,  who reports that he has spoken with his sister [name], and is now giving permission for Patient to discharge back with Mrs. [name] until he can get here on Tuesday. CSW has updated Patient's RN.    Lorrine Kin, MSW, [place] Clinical Social Worker [phone]

## 2016-12-20 NOTE — ED Notes (Addendum)
BEHAVIORAL HEALTH ROUNDING Patient sleeping: Yes.   Patient alert and oriented: not applicable SLEEPING Behavior appropriate: Yes.  ; If no, describe: SLEEPING Nutrition and fluids offered: No SLEEPING Toileting and hygiene offered: NoSLEEPING Sitter present: safety sitter at bedside and Q 15 min safety rounds and observation. Law enforcement present: Yes ODS

## 2016-12-20 NOTE — ED Notes (Signed)
BEHAVIORAL HEALTH ROUNDING Patient sleeping: Yes.   Patient alert and oriented: not applicable SLEEPING Behavior appropriate: Yes.  ; If no, describe: SLEEPING Nutrition and fluids offered: No SLEEPING Toileting and hygiene offered: NoSLEEPING Sitter present: not applicable, Q 15 min safety rounds and observation. Law enforcement present: Yes ODS 

## 2016-12-20 NOTE — ED Notes (Addendum)
BEHAVIORAL HEALTH ROUNDING Patient sleeping: Yes.   Patient alert and oriented: not applicable SLEEPING Behavior appropriate: Yes.  ; If no, describe: SLEEPING Nutrition and fluids offered: No SLEEPING Toileting and hygiene offered: NoSLEEPING Sitter present: safety sitter at bedside and  Q 15 min safety rounds and observation. Law enforcement present: Yes ODS

## 2016-12-20 NOTE — Progress Notes (Signed)
CSW contacted Patient's son and legal guardian, Khiara Shuping to provide update. Mr. Nau reports understanding that patient does not have a 3 night inpatient qualifying stay in the last 30 days and placement would be private pay. Pt's son manages pt's money and reports that Patient does not qualify for Medicaid due to income. Mr. Mikelson reports that he will be leaving [place], [place] on tomorrow and will be here on Tuesday at the latest to pick his mother up. Mr. Muckey reports that his sister, [name], is not answering his phone calls but notes that he does note that he will be here to pick mother up. RN made aware.    Lorrine Kin, MSW, [place] Clinical Social Worker [phone]

## 2016-12-20 NOTE — ED Provider Notes (Signed)
-----------------------------------------   6:20 AM on 12/20/2016 -----------------------------------------   Blood pressure 128/76, pulse (!) 112, temperature 98.6 F (37 C), temperature source Axillary, resp. rate 18, height 5' (1.524 m), weight 61.2 kg (135 lb), SpO2 100 %.  The patient had no acute events since last update.  Calm and cooperative at this time.  Disposition is pending Psychiatry/Behavioral Medicine team recommendations.     Darel Hong, MD 12/20/16 [phone]

## 2017-02-07 ENCOUNTER — Encounter: Payer: Self-pay | Admitting: Emergency Medicine

## 2017-02-07 ENCOUNTER — Emergency Department: Payer: Medicare Other

## 2017-02-07 ENCOUNTER — Emergency Department
Admission: EM | Admit: 2017-02-07 | Discharge: 2017-02-07 | Disposition: A | Discharge status: Home / Self Care | Payer: Medicare Other | Attending: Emergency Medicine | Admitting: Emergency Medicine

## 2017-02-07 DIAGNOSIS — G301 Alzheimer's disease with late onset: Secondary | ICD-10-CM | POA: Insufficient documentation

## 2017-02-07 DIAGNOSIS — R05 Cough: Secondary | ICD-10-CM | POA: Diagnosis present

## 2017-02-07 DIAGNOSIS — Z791 Long term (current) use of non-steroidal anti-inflammatories (NSAID): Secondary | ICD-10-CM | POA: Diagnosis not present

## 2017-02-07 DIAGNOSIS — Y999 Unspecified external cause status: Secondary | ICD-10-CM | POA: Insufficient documentation

## 2017-02-07 DIAGNOSIS — Y939 Activity, unspecified: Secondary | ICD-10-CM | POA: Insufficient documentation

## 2017-02-07 DIAGNOSIS — E039 Hypothyroidism, unspecified: Secondary | ICD-10-CM | POA: Insufficient documentation

## 2017-02-07 DIAGNOSIS — Z79899 Other long term (current) drug therapy: Secondary | ICD-10-CM | POA: Diagnosis not present

## 2017-02-07 DIAGNOSIS — J181 Lobar pneumonia, unspecified organism: Secondary | ICD-10-CM | POA: Diagnosis not present

## 2017-02-07 DIAGNOSIS — J189 Pneumonia, unspecified organism: Secondary | ICD-10-CM

## 2017-02-07 DIAGNOSIS — E119 Type 2 diabetes mellitus without complications: Secondary | ICD-10-CM | POA: Diagnosis not present

## 2017-02-07 DIAGNOSIS — X58XXXA Exposure to other specified factors, initial encounter: Secondary | ICD-10-CM | POA: Insufficient documentation

## 2017-02-07 DIAGNOSIS — Y929 Unspecified place or not applicable: Secondary | ICD-10-CM | POA: Insufficient documentation

## 2017-02-07 DIAGNOSIS — W57XXXA Bitten or stung by nonvenomous insect and other nonvenomous arthropods, initial encounter: Secondary | ICD-10-CM

## 2017-02-07 DIAGNOSIS — S50362A Insect bite (nonvenomous) of left elbow, initial encounter: Secondary | ICD-10-CM | POA: Diagnosis not present

## 2017-02-07 MED ORDER — BENZONATATE 100 MG PO CAPS: 100.0000 mg | ORAL_CAPSULE | Freq: Four times a day (QID) | ORAL | 0 refills | Status: DC | PRN

## 2017-02-07 MED ORDER — AZITHROMYCIN 250 MG PO TABS: ORAL_TABLET | ORAL | 0 refills | Status: DC

## 2017-02-07 MED ORDER — CEFTRIAXONE SODIUM 1 G IJ SOLR
1.0000 g | Freq: Once | INTRAMUSCULAR | Status: AC
  Administered 2017-02-07: 1 g via INTRAMUSCULAR
  Filled 2017-02-07: qty 10

## 2017-02-07 MED ORDER — AZITHROMYCIN 500 MG PO TABS
500.0000 mg | ORAL_TABLET | Freq: Once | ORAL | Status: AC
  Administered 2017-02-07: 500 mg via ORAL
  Filled 2017-02-07: qty 1

## 2017-02-07 NOTE — ED Provider Notes (Signed)
[facility] Emergency Department Provider Note  ____________________________________________  Time seen: Approximately 6:57 PM  I have reviewed the triage vital signs and the nursing notes.   HISTORY  Chief Complaint Cough    HPI Suzanne Contreras is a 83 y.o. female who presents emergency Department with her daughter for a complaint of ongoing cold, coughing, nasal congestion, fever, weakness. The patient presents with her daughter who is caregiver. Patient has dementia and is noncontributory to the conversation. Per the patient'sdaughter, patient has had cold like symptoms for the past 7-10 days. Patient had a chest x-ray 5 days ago which was clear. Today, cough worsen, patient has been tired, developed a fever at home. Patient was given Tylenol prior to arrival. No shortness of breath or use of accessory muscles to breathe. No other complaints at this time.  Patient has a history of recurring pneumonia. Patient has been intubated and post in ICU for previous episodes of pneumonia. No recent occurrence of ICU admission or intubation.   Past Medical History:  Diagnosis Date  . Dementia   . Diabetes mellitus without complication ([place])   . Skin cancer     Patient Active Problem List   Diagnosis Date Noted  . Altered mental status 12/13/2016  . Confusion 12/12/2016  . Acute delirium 12/10/2016  . UTI (urinary tract infection) 12/10/2016  . Vaginal lump 10/22/2016  . Hypothyroidism due to acquired atrophy of thyroid 02/20/2016  . Late onset Alzheimer's disease with behavioral disturbance 11/21/2015    History reviewed. No pertinent surgical history.  Prior to Admission medications   Medication Sig Start Date End Date Taking? Authorizing Provider  azithromycin (ZITHROMAX Z-PAK) 250 MG tablet Take 2 tablets (500 mg) on  Day 1,  followed by 1 tablet (250 mg) once daily on Days 2 through 5. 02/07/17   Cuthriell, Charline Bills, PA-C  benzonatate (TESSALON PERLES)  100 MG capsule Take 1 capsule (100 mg total) by mouth every 6 (six) hours as needed for cough. 02/07/17 02/07/18  Cuthriell, Charline Bills, PA-C  haloperidol (HALDOL) 0.5 MG tablet Take 1 tablet (0.5 mg total) by mouth 3 (three) times daily. 12/14/16   Vaughan Basta, MD  levothyroxine (SYNTHROID, LEVOTHROID) 25 MCG tablet Take 1 tablet (25 mcg total) by mouth daily before breakfast. 12/15/16   Vaughan Basta, MD  naproxen sodium (ANAPROX) 220 MG tablet Take 220 mg by mouth 2 (two) times daily with a meal.    [provider]  traZODone (DESYREL) 50 MG tablet Take 0.5 tablets (25 mg total) by mouth at bedtime. 12/14/16   Vaughan Basta, MD    Allergies Penicillins  No family history on file.  Social History Social History  Substance Use Topics  . Smoking status: Never Smoker  . Smokeless tobacco: Never Used  . Alcohol use No     Review of Systems  Constitutional: Positive fever/chills. Positive for weakness. Eyes: No visual changes. No discharge ENT: Positive for nasal congestion, scratchy throat. Cardiovascular: no chest pain. Respiratory: Positive cough. No SOB. Gastrointestinal: No abdominal pain.  No nausea, no vomiting.  No diarrhea.  No constipation. Genitourinary: Negative for dysuria. No hematuria Musculoskeletal: Negative for musculoskeletal pain. Skin: Negative for rash, abrasions, lacerations, ecchymosis. Neurological: Negative for headaches, focal weakness or numbness. 10-point ROS otherwise negative.  ____________________________________________   PHYSICAL EXAM:  VITAL SIGNS: ED Triage Vitals  Enc Vitals Group     BP 02/07/17 1727 120/65     Pulse Rate 02/07/17 1727 98  Resp 02/07/17 1727 17     Temp 02/07/17 1725 99.9 F (37.7 C)     Temp Source 02/07/17 1725 Oral     SpO2 02/07/17 1727 98 %     Weight 02/07/17 1725 135 lb (61.2 kg)     Height 02/07/17 1725 5' (1.524 m)     Head Circumference --      Peak Flow --      Pain  Score 02/07/17 1744 0     Pain Loc --      Pain Edu? --      Excl. in [place]? --      Constitutional: Alert and oriented. Well appearing and in no acute distress. Eyes: Conjunctivae are normal. PERRL. EOMI. Head: Atraumatic. ENT:      Ears: EACs and TMs unremarkable bilaterally.      Nose: Moderate congestion/rhinnorhea.      Mouth/Throat: Mucous membranes are moist. Oropharynx is nonerythematous and nonedematous. Uvula is midline. Neck: No stridor.   Hematological/Lymphatic/Immunilogical: No cervical lymphadenopathy. Cardiovascular: Normal rate, regular rhythm. Normal S1 and S2.  Good peripheral circulation. Respiratory: Normal respiratory effort without tachypnea or retractions. Lungs with a few crackles to the left lower lung field. No wheezing, rales, rhonchi.Kermit Balo air entry to the bases with no decreased or absent breath sounds. Gastrointestinal: Bowel sounds 4 quadrants. Soft and nontender to palpation. No guarding or rigidity. No palpable masses. No distention.  Musculoskeletal: Full range of motion to all extremities. No gross deformities appreciated. Neurologic:  Normal speech and language. No gross focal neurologic deficits are appreciated.  Skin:  Skin is warm, dry and intact. No rash noted. While present, patient's family member noticed a tick to the left forearm. No surrounding erythema, edema. Non-engorged tick. Psychiatric: Mood and affect are normal. Speech and behavior are normal. Patient exhibits appropriate insight and judgement.   ____________________________________________   LABS (all labs ordered are listed, but only abnormal results are displayed)  Labs Reviewed - No data to display ____________________________________________  EKG   ____________________________________________  RADIOLOGY Diamantina Providence Cuthriell, personally viewed and evaluated these images (plain radiographs) as part of my medical decision making, as well as reviewing the written report  by the radiologist.  Dg Chest 2 View  Result Date: 02/07/2017 CLINICAL DATA:  Fever today.  Cold symptoms 1 week. EXAM: CHEST  2 VIEW COMPARISON:  12/10/2016 FINDINGS: Patient is rotated to the right. Lungs are adequately inflated with bibasilar opacification likely due to vascular crowding, although cannot exclude atelectasis/ infection in the lung bases. No evidence of effusion. Cardiomediastinal silhouette is within normal. There are mild degenerative changes of the spine. Flattening of the hemidiaphragms on the lateral film. IMPRESSION: Mild bibasilar opacification which may be due to atelectasis, infection versus vascular crowding. Repeat PA chest radiograph with better positioning may be helpful for further evaluation. Electronically Signed   By: Marin Olp M.D.   On: 02/07/2017 18:42    ____________________________________________    PROCEDURES  Procedure(s) performed:    .Foreign Body Removal Date/Time: 02/07/2017 7:29 PM Performed by: Betha Loa D Authorized by: Betha Loa D  Consent: Verbal consent obtained. Consent given by: guardian Patient understanding: patient states understanding of the procedure being performed Body area: skin General location: upper extremity Location details: left elbow  Sedation: Patient sedated: no Patient restrained: no Patient cooperative: yes Localization method: visualized Removal mechanism: hemostat Tendon involvement: superficial Depth: subcutaneous Complexity: simple 1 objects recovered. Objects recovered: tick Post-procedure assessment: foreign body removed Patient tolerance: Patient tolerated  the procedure well with no immediate complications      Medications  cefTRIAXone (ROCEPHIN) injection 1 g (1 g Intramuscular Given 02/07/17 1920)  azithromycin (ZITHROMAX) tablet 500 mg (500 mg Oral Given 02/07/17 1920)     ____________________________________________   INITIAL IMPRESSION / ASSESSMENT AND PLAN  / ED COURSE  Pertinent labs & imaging results that were available during my care of the patient were reviewed by me and considered in my medical decision making (see chart for details).  Review of the [place] CSRS was performed in accordance of the [place] prior to dispensing any controlled drugs.     Patient's diagnosis is consistent with Kidney or pneumonia. The patient has had URI symptoms for the past 7-10 days. X-ray reveals no acute consolidation, but does have some mild changes in the left lower lung field which are consistent with lung sounds in the left lower field. Due to patient's history, findings, length of duration, patient will be treated with antibiotics. Patient is given a shot of Rocephin and Zithromax in the emergency department. At this time, patient appears to be discharged home with outpatient therapy. No indication for further labs or imaging at this time.. Patient will be discharged home with prescriptions for Zithromax and Tessalon Perles. Patient is to follow up with primary care as needed or otherwise directed. Patient is given ED precautions to return to the ED for any worsening or new symptoms.     ____________________________________________  FINAL CLINICAL IMPRESSION(S) / ED DIAGNOSES  Final diagnoses:  Community acquired pneumonia of left lower lobe of lung ([place])  Tick bite, initial encounter      NEW MEDICATIONS STARTED DURING THIS VISIT:  New Prescriptions   AZITHROMYCIN (ZITHROMAX Z-PAK) 250 MG TABLET    Take 2 tablets (500 mg) on  Day 1,  followed by 1 tablet (250 mg) once daily on Days 2 through 5.   BENZONATATE (TESSALON PERLES) 100 MG CAPSULE    Take 1 capsule (100 mg total) by mouth every 6 (six) hours as needed for cough.        This chart was dictated using voice recognition software/Dragon. Despite best efforts to proofread, errors can occur which can change the meaning. Any change was purely unintentional.    Darletta Moll,  PA-C 02/07/17 1930    Darel Hong, MD 02/07/17 [phone]

## 2017-02-07 NOTE — ED Triage Notes (Signed)
Patient presents to ED via POV from home with care giver. Patient has dementia. Caregiver reports nasal congestion and cough. Chest xray was completed and was negative per family.

## 2017-02-07 NOTE — ED Notes (Signed)
Pt daughter reports that pt has a cold for the last week - pt had cxr during the week and it was normal - pt started with a fever today (not taken due to no thermometer) - pt temp here was 99 - pt continues with cough and congestion and daughter concerned

## 2017-03-14 ENCOUNTER — Emergency Department: Payer: Medicare Other

## 2017-03-14 ENCOUNTER — Encounter: Payer: Self-pay | Admitting: Emergency Medicine

## 2017-03-14 ENCOUNTER — Observation Stay
Admission: EM | Admit: 2017-03-14 | Discharge: 2017-03-17 | Disposition: A | Discharge status: Home / Self Care | Payer: Medicare Other | Attending: Internal Medicine | Admitting: Internal Medicine

## 2017-03-14 DIAGNOSIS — N179 Acute kidney failure, unspecified: Secondary | ICD-10-CM

## 2017-03-14 DIAGNOSIS — Z85828 Personal history of other malignant neoplasm of skin: Secondary | ICD-10-CM | POA: Insufficient documentation

## 2017-03-14 DIAGNOSIS — E860 Dehydration: Principal | ICD-10-CM | POA: Insufficient documentation

## 2017-03-14 DIAGNOSIS — E119 Type 2 diabetes mellitus without complications: Secondary | ICD-10-CM | POA: Diagnosis not present

## 2017-03-14 DIAGNOSIS — M6281 Muscle weakness (generalized): Secondary | ICD-10-CM | POA: Insufficient documentation

## 2017-03-14 DIAGNOSIS — E870 Hyperosmolality and hypernatremia: Secondary | ICD-10-CM | POA: Diagnosis not present

## 2017-03-14 DIAGNOSIS — E43 Unspecified severe protein-calorie malnutrition: Secondary | ICD-10-CM | POA: Diagnosis not present

## 2017-03-14 DIAGNOSIS — G9341 Metabolic encephalopathy: Secondary | ICD-10-CM | POA: Insufficient documentation

## 2017-03-14 DIAGNOSIS — Z880 Allergy status to penicillin: Secondary | ICD-10-CM | POA: Diagnosis not present

## 2017-03-14 DIAGNOSIS — W19XXXA Unspecified fall, initial encounter: Secondary | ICD-10-CM | POA: Diagnosis not present

## 2017-03-14 DIAGNOSIS — Z66 Do not resuscitate: Secondary | ICD-10-CM | POA: Insufficient documentation

## 2017-03-14 DIAGNOSIS — R410 Disorientation, unspecified: Secondary | ICD-10-CM

## 2017-03-14 DIAGNOSIS — Z79899 Other long term (current) drug therapy: Secondary | ICD-10-CM | POA: Diagnosis not present

## 2017-03-14 LAB — CBC WITH DIFFERENTIAL/PLATELET
BASOS ABS: 0 10*3/uL (ref 0–0.1)
Basophils Relative: 0 %
EOS PCT: 0 %
Eosinophils Absolute: 0 10*3/uL (ref 0–0.7)
HCT: 37.5 % (ref 35.0–47.0)
Hemoglobin: 12.4 g/dL (ref 12.0–16.0)
LYMPHS PCT: 6 %
Lymphs Abs: 0.8 10*3/uL — ABNORMAL LOW (ref 1.0–3.6)
MCH: 31.5 pg (ref 26.0–34.0)
MCHC: 33.2 g/dL (ref 32.0–36.0)
MCV: 95 fL (ref 80.0–100.0)
MONO ABS: 0.7 10*3/uL (ref 0.2–0.9)
Monocytes Relative: 6 %
Neutro Abs: 11.9 10*3/uL — ABNORMAL HIGH (ref 1.4–6.5)
Neutrophils Relative %: 88 %
PLATELETS: 218 10*3/uL (ref 150–440)
RBC: 3.95 MIL/uL (ref 3.80–5.20)
RDW: 14.9 % — AB (ref 11.5–14.5)
WBC: 13.5 10*3/uL — ABNORMAL HIGH (ref 3.6–11.0)

## 2017-03-14 LAB — COMPREHENSIVE METABOLIC PANEL
ALT: 18 U/L (ref 14–54)
AST: 39 U/L (ref 15–41)
Albumin: 3.5 g/dL (ref 3.5–5.0)
Alkaline Phosphatase: 62 U/L (ref 38–126)
Anion gap: 11 (ref 5–15)
BUN: 43 mg/dL — AB (ref 6–20)
CHLORIDE: 110 mmol/L (ref 101–111)
CO2: 27 mmol/L (ref 22–32)
Calcium: 9 mg/dL (ref 8.9–10.3)
Creatinine, Ser: 1.21 mg/dL — ABNORMAL HIGH (ref 0.44–1.00)
GFR calc Af Amer: 46 mL/min — ABNORMAL LOW (ref >60)
GFR, EST NON AFRICAN AMERICAN: 40 mL/min — AB (ref >60)
Glucose, Bld: 127 mg/dL — ABNORMAL HIGH (ref 65–99)
POTASSIUM: 4.1 mmol/L (ref 3.5–5.1)
Sodium: 148 mmol/L — ABNORMAL HIGH (ref 135–145)
Total Bilirubin: 0.7 mg/dL (ref 0.3–1.2)
Total Protein: 6.6 g/dL (ref 6.5–8.1)

## 2017-03-14 LAB — URINALYSIS, COMPLETE (UACMP) WITH MICROSCOPIC
BACTERIA UA: NONE SEEN
BILIRUBIN URINE: NEGATIVE
GLUCOSE, UA: NEGATIVE mg/dL
HGB URINE DIPSTICK: NEGATIVE
KETONES UR: 20 mg/dL — AB
Leukocytes, UA: NEGATIVE
NITRITE: NEGATIVE
Protein, ur: 100 mg/dL — AB
Specific Gravity, Urine: 1.024 (ref 1.005–1.030)
Squamous Epithelial / LPF: NONE SEEN
pH: 5 (ref 5.0–8.0)

## 2017-03-14 MED ORDER — SODIUM CHLORIDE 0.9 % IV BOLUS (SEPSIS)
1000.0000 mL | Freq: Once | INTRAVENOUS | Status: AC
  Administered 2017-03-14: 1000 mL via INTRAVENOUS
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
## 2017-03-14 NOTE — ED Notes (Signed)
Neysa Bonito from [facility]PS on update about patient.

## 2017-03-14 NOTE — ED Notes (Signed)
Admitting dr at bedside.  
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
## 2017-03-14 NOTE — ED Notes (Signed)
Sane nurse back to bedside with pt

## 2017-03-15 ENCOUNTER — Ambulatory Visit: Payer: Self-pay | Admitting: Urology

## 2017-03-15 DIAGNOSIS — E860 Dehydration: Secondary | ICD-10-CM | POA: Diagnosis not present

## 2017-03-15 LAB — MRSA PCR SCREENING: MRSA by PCR: POSITIVE — AB

## 2017-03-15 LAB — BASIC METABOLIC PANEL
ANION GAP: 4 — AB (ref 5–15)
BUN: 35 mg/dL — ABNORMAL HIGH (ref 6–20)
CHLORIDE: 115 mmol/L — AB (ref 101–111)
CO2: 27 mmol/L (ref 22–32)
CREATININE: 0.71 mg/dL (ref 0.44–1.00)
Calcium: 8.6 mg/dL — ABNORMAL LOW (ref 8.9–10.3)
GFR calc non Af Amer: >60 mL/min (ref >60)
Glucose, Bld: 114 mg/dL — ABNORMAL HIGH (ref 65–99)
Potassium: 3.7 mmol/L (ref 3.5–5.1)
Sodium: 146 mmol/L — ABNORMAL HIGH (ref 135–145)

## 2017-03-15 MED ORDER — ACETAMINOPHEN 650 MG RE SUPP: 650.0000 mg | Freq: Four times a day (QID) | RECTAL | Status: DC | PRN

## 2017-03-15 MED ORDER — ACETAMINOPHEN 325 MG PO TABS: 650.0000 mg | ORAL_TABLET | Freq: Four times a day (QID) | ORAL | Status: DC | PRN

## 2017-03-15 MED ORDER — OLANZAPINE 5 MG PO TABS
2.5000 mg | ORAL_TABLET | Freq: Two times a day (BID) | ORAL | Status: DC
  Administered 2017-03-15 – 2017-03-17 (×4): 2.5 mg via ORAL
  Filled 2017-03-15 (×5): qty 1

## 2017-03-15 MED ORDER — ENOXAPARIN SODIUM 30 MG/0.3ML ~~LOC~~ SOLN
30.0000 mg | SUBCUTANEOUS | Status: DC
  Administered 2017-03-17: 30 mg via SUBCUTANEOUS
  Filled 2017-03-15 (×2): qty 0.3

## 2017-03-15 MED ORDER — TURMERIC 500 MG PO CAPS: 1.0000 | ORAL_CAPSULE | Freq: Two times a day (BID) | ORAL | Status: DC

## 2017-03-15 MED ORDER — ONDANSETRON HCL 4 MG/2ML IJ SOLN: 4.0000 mg | Freq: Four times a day (QID) | INTRAMUSCULAR | Status: DC | PRN

## 2017-03-15 MED ORDER — VITAMIN D 1000 UNITS PO TABS
1000.0000 [IU] | ORAL_TABLET | Freq: Every day | ORAL | Status: DC
  Administered 2017-03-15 – 2017-03-17 (×3): 1000 [IU] via ORAL
  Filled 2017-03-15 (×3): qty 1

## 2017-03-15 MED ORDER — CHLORHEXIDINE GLUCONATE CLOTH 2 % EX PADS
6.0000 | MEDICATED_PAD | Freq: Every day | CUTANEOUS | Status: DC
  Administered 2017-03-16 – 2017-03-17 (×2): 6 via TOPICAL

## 2017-03-15 MED ORDER — ENSURE ENLIVE PO LIQD
237.0000 mL | Freq: Two times a day (BID) | ORAL | Status: DC
  Administered 2017-03-16 (×2): 237 mL via ORAL

## 2017-03-15 MED ORDER — DEXTROSE 5 % IV SOLN
INTRAVENOUS | Status: AC
  Administered 2017-03-15: 06:00:00 via INTRAVENOUS

## 2017-03-15 MED ORDER — POLYETHYLENE GLYCOL 3350 17 G PO PACK
17.0000 g | PACK | Freq: Every day | ORAL | Status: DC | PRN
  Administered 2017-03-16: 17 g via ORAL
  Filled 2017-03-15: qty 1

## 2017-03-15 MED ORDER — MUPIROCIN 2 % EX OINT
1.0000 "application " | TOPICAL_OINTMENT | Freq: Two times a day (BID) | CUTANEOUS | Status: DC
  Administered 2017-03-16 – 2017-03-17 (×3): 1 via NASAL
  Filled 2017-03-15 (×2): qty 22

## 2017-03-15 MED ORDER — ADULT MULTIVITAMIN W/MINERALS CH
1.0000 | ORAL_TABLET | Freq: Every day | ORAL | Status: DC
  Administered 2017-03-16 – 2017-03-17 (×2): 1 via ORAL
  Filled 2017-03-15 (×2): qty 1

## 2017-03-15 MED ORDER — DIVALPROEX SODIUM 125 MG PO CSDR
250.0000 mg | DELAYED_RELEASE_CAPSULE | Freq: Two times a day (BID) | ORAL | Status: DC
  Administered 2017-03-15 – 2017-03-17 (×4): 250 mg via ORAL
  Filled 2017-03-15 (×7): qty 2

## 2017-03-15 MED ORDER — VITAMIN C 500 MG PO TABS
500.0000 mg | ORAL_TABLET | Freq: Every day | ORAL | Status: DC
  Administered 2017-03-15 – 2017-03-17 (×3): 500 mg via ORAL
  Filled 2017-03-15 (×3): qty 1

## 2017-03-15 MED ORDER — HALOPERIDOL LACTATE 5 MG/ML IJ SOLN
1.0000 mg | Freq: Four times a day (QID) | INTRAMUSCULAR | Status: DC | PRN
  Administered 2017-03-15 – 2017-03-16 (×2): 1 mg via INTRAVENOUS
  Filled 2017-03-15 (×2): qty 1

## 2017-03-15 MED ORDER — FLUTICASONE PROPIONATE 50 MCG/ACT NA SUSP
2.0000 | Freq: Every day | NASAL | Status: DC
  Administered 2017-03-16: 2 via NASAL
  Filled 2017-03-15 (×2): qty 16

## 2017-03-15 MED ORDER — CELECOXIB 200 MG PO CAPS
200.0000 mg | ORAL_CAPSULE | Freq: Every day | ORAL | Status: DC
  Administered 2017-03-15 – 2017-03-17 (×3): 200 mg via ORAL
  Filled 2017-03-15 (×3): qty 1

## 2017-03-15 MED ORDER — ONDANSETRON HCL 4 MG PO TABS: 4.0000 mg | ORAL_TABLET | Freq: Four times a day (QID) | ORAL | Status: DC | PRN

## 2017-03-15 MED ORDER — SODIUM CHLORIDE 0.9% FLUSH
3.0000 mL | Freq: Two times a day (BID) | INTRAVENOUS | Status: DC
Start: 2017-03-15 — End: 2017-03-17
  Administered 2017-03-15 – 2017-03-17 (×5): 3 mL via INTRAVENOUS
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
## 2017-03-15 NOTE — Progress Notes (Signed)
Pt impossible to redirect and keep calm. Very confused to place, time and situation. Attempting OOB several  times. Suspicious of staff. Dr Manuella Ghazi called and 1mg  haldol ordered and given.
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
## 2017-03-15 NOTE — Progress Notes (Signed)
Pt calm and working on busy quilt and dozing off and on.

## 2017-03-15 NOTE — Progress Notes (Signed)
Pt increasingly agitated after family members left. Hard to redirect.  Attempting to get OOB.

## 2017-03-15 NOTE — NC FL2 (Signed)
[place] LEVEL OF CARE SCREENING TOOL     IDENTIFICATION  Patient Name: Suzanne Contreras Birthdate: Dec 16, 1929 Sex: female Admission Date (Current Location): 03/14/2017  [place] and [place] Number:  Engineering geologist and Address:  [facility], [address] [place], [place] [address]      Provider Number: [id number]  Attending Physician Name and Address:  Max Sane, MD  Relative Name and Phone Number:  [facility] Daughter [phone] or [name], [name] [phone]     Current Level of Care: Hospital Recommended Level of Care: [place] Prior Approval Number:    Date Approved/Denied:   PASRR Number: [id number]  Discharge Plan: SNF    Current Diagnoses: Patient Active Problem List   Diagnosis Date Noted  . Fall 03/14/2017  . Altered mental status 12/13/2016  . Confusion 12/12/2016  . Acute delirium 12/10/2016  . UTI (urinary tract infection) 12/10/2016  . Vaginal lump 10/22/2016  . Hypothyroidism due to acquired atrophy of thyroid 02/20/2016  . Late onset Alzheimer's disease with behavioral disturbance 11/21/2015    Orientation RESPIRATION BLADDER Height & Weight     Self  Normal Incontinent Weight: 95 lb (43.1 kg) Height:  5' (152.4 cm)  BEHAVIORAL SYMPTOMS/MOOD NEUROLOGICAL BOWEL NUTRITION STATUS      Continent Diet (Mechanical Soft)  AMBULATORY STATUS COMMUNICATION OF NEEDS Skin   Extensive Assist Verbally Normal                       Personal Care Assistance Level of Assistance  Bathing, Feeding, Dressing Bathing Assistance: Limited assistance Feeding assistance: Limited assistance Dressing Assistance: Limited assistance     Functional Limitations Info  Sight, Hearing, Speech Sight Info: Adequate Hearing Info: Adequate Speech Info: Adequate    SPECIAL CARE FACTORS FREQUENCY  PT (By licensed PT)     PT Frequency: 2x a week              Contractures Contractures Info: Not  present    Additional Factors Info  Psychotropic, Isolation Precautions Code Status Info: DNR Allergies Info: PENICILLINS  Psychotropic Info: divalproex (DEPAKOTE SPRINKLE) capsule 250 mg and OLANZapine (ZYPREXA) tablet 2.5 mg   Isolation Precautions Info: Contact precautions for MRSA     Current Medications (03/15/2017):  This is the current hospital active medication list Current Facility-Administered Medications  Medication Dose Route Frequency Provider Last Rate Last Dose  . acetaminophen (TYLENOL) tablet 650 mg  650 mg Oral Q6H PRN Salary, Montell D, MD       Or  . acetaminophen (TYLENOL) suppository 650 mg  650 mg Rectal Q6H PRN Salary, Montell D, MD      . celecoxib (CELEBREX) capsule 200 mg  200 mg Oral Daily Salary, Montell D, MD   200 mg at 03/15/17 0957  . [START ON 03/16/2017] Chlorhexidine Gluconate Cloth 2 % PADS 6 each  6 each Topical Q0600 Max Sane, MD      . cholecalciferol (VITAMIN D) tablet 1,000 Units  1,000 Units Oral Daily Salary, Holly Bodily D, MD   1,000 Units at 03/15/17 0959  . dextrose 5 % solution   Intravenous Continuous Salary, Avel Peace, MD   Stopped at 03/15/17 1200  . divalproex (DEPAKOTE SPRINKLE) capsule 250 mg  250 mg Oral BID Salary, Montell D, MD   250 mg at 03/15/17 1000  . enoxaparin (LOVENOX) injection 30 mg  30 mg Subcutaneous Q24H Salary, Montell D, MD      . fluticasone (FLONASE) 50 MCG/ACT  nasal spray 2 spray  2 spray Each Nare Daily Salary, Montell D, MD      . mupirocin ointment (BACTROBAN) 2 % 1 application  1 application Nasal BID Max Sane, MD      . OLANZapine (ZYPREXA) tablet 2.5 mg  2.5 mg Oral BID Salary, Montell D, MD   2.5 mg at 03/15/17 0957  . ondansetron (ZOFRAN) tablet 4 mg  4 mg Oral Q6H PRN Salary, Montell D, MD       Or  . ondansetron (ZOFRAN) injection 4 mg  4 mg Intravenous Q6H PRN Salary, Montell D, MD      . polyethylene glycol (MIRALAX / GLYCOLAX) packet 17 g  17 g Oral Daily PRN Salary, Montell D, MD      . sodium  chloride flush (NS) 0.9 % injection 3 mL  3 mL Intravenous Q12H Salary, Montell D, MD   3 mL at 03/15/17 7353  . vitamin C (ASCORBIC ACID) tablet 500 mg  500 mg Oral Daily Salary, Montell D, MD   500 mg at 03/15/17 1000     Discharge Medications: Please see discharge summary for a list of discharge medications.  Relevant Imaging Results:  Relevant Lab Results:   Additional Information SSN [id number]  Ross Ludwig, [place]
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
## 2017-03-15 NOTE — Care Management Obs Status (Signed)
[place] NOTIFICATION   Patient Details  Name: Suzanne Contreras MRN: [id number] Date of Birth: [birth year]   Medicare Observation Status Notification Given:  Yes    Jolly Mango, RN 03/15/2017, 10:16 AM
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
## 2017-03-16 ENCOUNTER — Ambulatory Visit
Admission: EM | Admit: 2017-03-16 | Discharge: 2017-03-16 | Disposition: A | Discharge status: Home / Self Care | Payer: No Typology Code available for payment source | Source: Ambulatory Visit | Attending: Emergency Medicine | Admitting: Emergency Medicine

## 2017-03-16 DIAGNOSIS — E860 Dehydration: Secondary | ICD-10-CM | POA: Diagnosis not present

## 2017-03-16 DIAGNOSIS — Z515 Encounter for palliative care: Secondary | ICD-10-CM | POA: Diagnosis not present

## 2017-03-16 DIAGNOSIS — Z0441 Encounter for examination and observation following alleged adult rape: Secondary | ICD-10-CM | POA: Insufficient documentation

## 2017-03-16 LAB — BASIC METABOLIC PANEL
Anion gap: 8 (ref 5–15)
BUN: 28 mg/dL — AB (ref 6–20)
CHLORIDE: 109 mmol/L (ref 101–111)
CO2: 26 mmol/L (ref 22–32)
CREATININE: 0.72 mg/dL (ref 0.44–1.00)
Calcium: 8.8 mg/dL — ABNORMAL LOW (ref 8.9–10.3)
GFR calc Af Amer: >60 mL/min (ref >60)
GFR calc non Af Amer: >60 mL/min (ref >60)
GLUCOSE: 109 mg/dL — AB (ref 65–99)
POTASSIUM: 3.6 mmol/L (ref 3.5–5.1)
SODIUM: 143 mmol/L (ref 135–145)

## 2017-03-16 LAB — CBC
HEMATOCRIT: 36.4 % (ref 35.0–47.0)
HEMOGLOBIN: 12 g/dL (ref 12.0–16.0)
MCH: 31 pg (ref 26.0–34.0)
MCHC: 33 g/dL (ref 32.0–36.0)
MCV: 94 fL (ref 80.0–100.0)
Platelets: 215 10*3/uL (ref 150–440)
RBC: 3.87 MIL/uL (ref 3.80–5.20)
RDW: 14.9 % — ABNORMAL HIGH (ref 11.5–14.5)
WBC: 11.5 10*3/uL — ABNORMAL HIGH (ref 3.6–11.0)

## 2017-03-16 LAB — CHLAMYDIA/NGC RT PCR (ARMC ONLY)
Chlamydia Tr: NOT DETECTED
N GONORRHOEAE: NOT DETECTED

## 2017-03-16 LAB — URINE CULTURE: Culture: NO GROWTH

## 2017-03-16 MED ORDER — HALOPERIDOL LACTATE 5 MG/ML IJ SOLN
1.0000 mg | INTRAMUSCULAR | Status: DC | PRN
Start: 2017-03-16 — End: 2017-03-17
  Administered 2017-03-16 (×3): 1 mg via INTRAVENOUS
  Filled 2017-03-16 (×3): qty 1

## 2017-03-16 NOTE — Progress Notes (Signed)
Rept to SANE RN Olivia Mackie. Daughter called this AM and wishes to proceed with  SANE examination. Olivia Mackie RN to call daughter and set up time for her to be here to give consent for the exam. Will continue to monitor.
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
## 2017-03-16 NOTE — Progress Notes (Signed)
In and out cath performed per Wilder Glade RN. Output from cath was 400 cc. Per Helene Kelp RN pt tolerated the procedure well. In and out cath done to obtain urine specimen for ordered tests. Pt is incontinent. Will continue to monitor.

## 2017-03-16 NOTE — Progress Notes (Signed)
Olivia Mackie SANE RN in to see patient. Pt's daughter here to give consent for the SANE examination.

## 2017-03-16 NOTE — Care Management (Signed)
Spoke with patient's daughter Suzanne Contreras regarding discharing home with home health. Agency preference is [facility].  [name] feels hospital bed would improve her ability to help patient with getting out of bed.  CM discussed that at present, there is no diagnosis documented that would meet medical criteria to be covered by medicare.  She is agreeable to have [facility] contact her to discuss cost if out of pocket.  Order for SN PT OT Aide and social work.  Discussed that if again decides to place under long term care, the home health social worker can assist without returning to the ED for placement. Referral called to [facility] with [facility].  Anticipate discharge within next 24 hours
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
## 2017-03-16 NOTE — Progress Notes (Addendum)
Pt remains confused. Pt oriented to self only. Pt is still fidgety and doesn't follow commands. Pt yells out and screams when staff in room and when staff isn't. Pt talks to people who aren't in room.  Pt is very impulsive and tries to get up frequently.  Low bed, floor mats and bed alarm remain intact.  Will continue to monitor.

## 2017-03-16 NOTE — Progress Notes (Signed)
Telesitter d/ced per MD order. Will continue to monitor.
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
## 2017-03-17 DIAGNOSIS — E860 Dehydration: Secondary | ICD-10-CM | POA: Diagnosis not present

## 2017-03-17 LAB — HEPATITIS B DNA, ULTRAQUANTITATIVE, PCR
HBV DNA SERPL PCR-ACNC: NOT DETECTED IU/mL
HBV DNA SERPL PCR-LOG IU: UNDETERMINED log10 IU/mL

## 2017-03-17 LAB — SYPHILIS: RPR W/REFLEX TO RPR TITER AND TREPONEMAL ANTIBODIES, TRADITIONAL SCREENING AND DIAGNOSIS ALGORITHM: RPR Ser Ql: NONREACTIVE

## 2017-03-17 LAB — HIV ANTIBODY (ROUTINE TESTING W REFLEX): HIV Screen 4th Generation wRfx: NONREACTIVE

## 2017-03-17 MED ORDER — DIVALPROEX SODIUM 125 MG PO CSDR: 250.0000 mg | DELAYED_RELEASE_CAPSULE | Freq: Two times a day (BID) | ORAL | 0 refills | Status: DC

## 2017-03-17 MED ORDER — OLANZAPINE 2.5 MG PO TABS: 2.5000 mg | ORAL_TABLET | Freq: Two times a day (BID) | ORAL | 0 refills | Status: DC
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
## 2017-03-17 NOTE — Care Management Note (Signed)
Case Management Note  Patient Details  Name: Suzanne Contreras MRN: [id number] Date of Birth: 1929/09/13  Subjective/Objective: Discharging today                   Action/Plan: Advanced notified of discharge.   Expected Discharge Date:  03/17/17               Expected Discharge Plan:  [place]  In-House Referral:     Discharge planning Services  CM Consult  Post Acute Care Choice:  Home Health Choice offered to:  Adult Children  DME Arranged:    DME Agency:     HH Arranged:  RN, PT, OT, Nurse's Aide, Social Work [employer] Agency:  [place]  Status of Service:  Completed, signed off  If discussed at [employer], dates discussed:    Additional Comments:  Jolly Mango, RN 03/17/2017, 9:50 AM
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
## 2017-03-17 NOTE — Progress Notes (Signed)
New referral for out patient Palliative to follow at home received from attending physician Dr. Manuella Ghazi. Referral made aware. Thank you. Flo Shanks RN, BSN, [facility], hospital Liaison [phone] c
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
## 2017-03-18 LAB — HEPATITIS C VRS RNA DETECT BY PCR-QUAL: HEPATITIS C VRS RNA BY PCR-QUAL: NEGATIVE

## 2017-10-08 ENCOUNTER — Other Ambulatory Visit: Payer: Self-pay | Admitting: Family

## 2017-10-08 DIAGNOSIS — M79605 Pain in left leg: Secondary | ICD-10-CM

## 2017-10-08 DIAGNOSIS — R609 Edema, unspecified: Secondary | ICD-10-CM

## 2017-10-08 DIAGNOSIS — M79604 Pain in right leg: Secondary | ICD-10-CM

## 2017-10-08 DIAGNOSIS — Z86718 Personal history of other venous thrombosis and embolism: Secondary | ICD-10-CM

## 2017-10-14 ENCOUNTER — Ambulatory Visit: Payer: Medicare (Managed Care)

## 2017-10-18 ENCOUNTER — Ambulatory Visit
Admission: RE | Admit: 2017-10-18 | Discharge: 2017-10-18 | Disposition: A | Discharge status: Home / Self Care | Payer: Medicare (Managed Care) | Source: Ambulatory Visit | Attending: Family | Admitting: Family

## 2017-10-18 DIAGNOSIS — I071 Rheumatic tricuspid insufficiency: Secondary | ICD-10-CM | POA: Insufficient documentation

## 2017-10-18 DIAGNOSIS — F0390 Unspecified dementia without behavioral disturbance: Secondary | ICD-10-CM | POA: Diagnosis not present

## 2017-10-18 DIAGNOSIS — R609 Edema, unspecified: Secondary | ICD-10-CM | POA: Diagnosis not present

## 2017-10-18 NOTE — Progress Notes (Signed)
*  PRELIMINARY RESULTS* Echocardiogram 2D Echocardiogram has been performed.  Suzanne Contreras 10/18/2017, 11:47 AM

## 2017-10-25 ENCOUNTER — Ambulatory Visit
Admission: RE | Admit: 2017-10-25 | Discharge: 2017-10-25 | Disposition: A | Discharge status: Home / Self Care | Payer: Medicare (Managed Care) | Source: Ambulatory Visit | Attending: Family | Admitting: Family

## 2017-10-25 DIAGNOSIS — R609 Edema, unspecified: Secondary | ICD-10-CM | POA: Diagnosis not present

## 2017-10-25 DIAGNOSIS — M79604 Pain in right leg: Secondary | ICD-10-CM | POA: Diagnosis not present

## 2017-10-25 DIAGNOSIS — Z86718 Personal history of other venous thrombosis and embolism: Secondary | ICD-10-CM | POA: Diagnosis not present

## 2017-10-25 DIAGNOSIS — M7121 Synovial cyst of popliteal space [Baker], right knee: Secondary | ICD-10-CM | POA: Insufficient documentation

## 2017-10-25 DIAGNOSIS — M7122 Synovial cyst of popliteal space [Baker], left knee: Secondary | ICD-10-CM | POA: Insufficient documentation

## 2017-10-25 DIAGNOSIS — M79605 Pain in left leg: Secondary | ICD-10-CM | POA: Diagnosis not present

## 2018-02-01 IMAGING — DX DG CHEST 1V PORT
1 series · 1 of 1 positions shown · non-contrast
Comparison: Radiograph 02/07/2017

CLINICAL DATA: Generalized weakness today.

EXAM:
PORTABLE CHEST 1 VIEW

[chest ap]
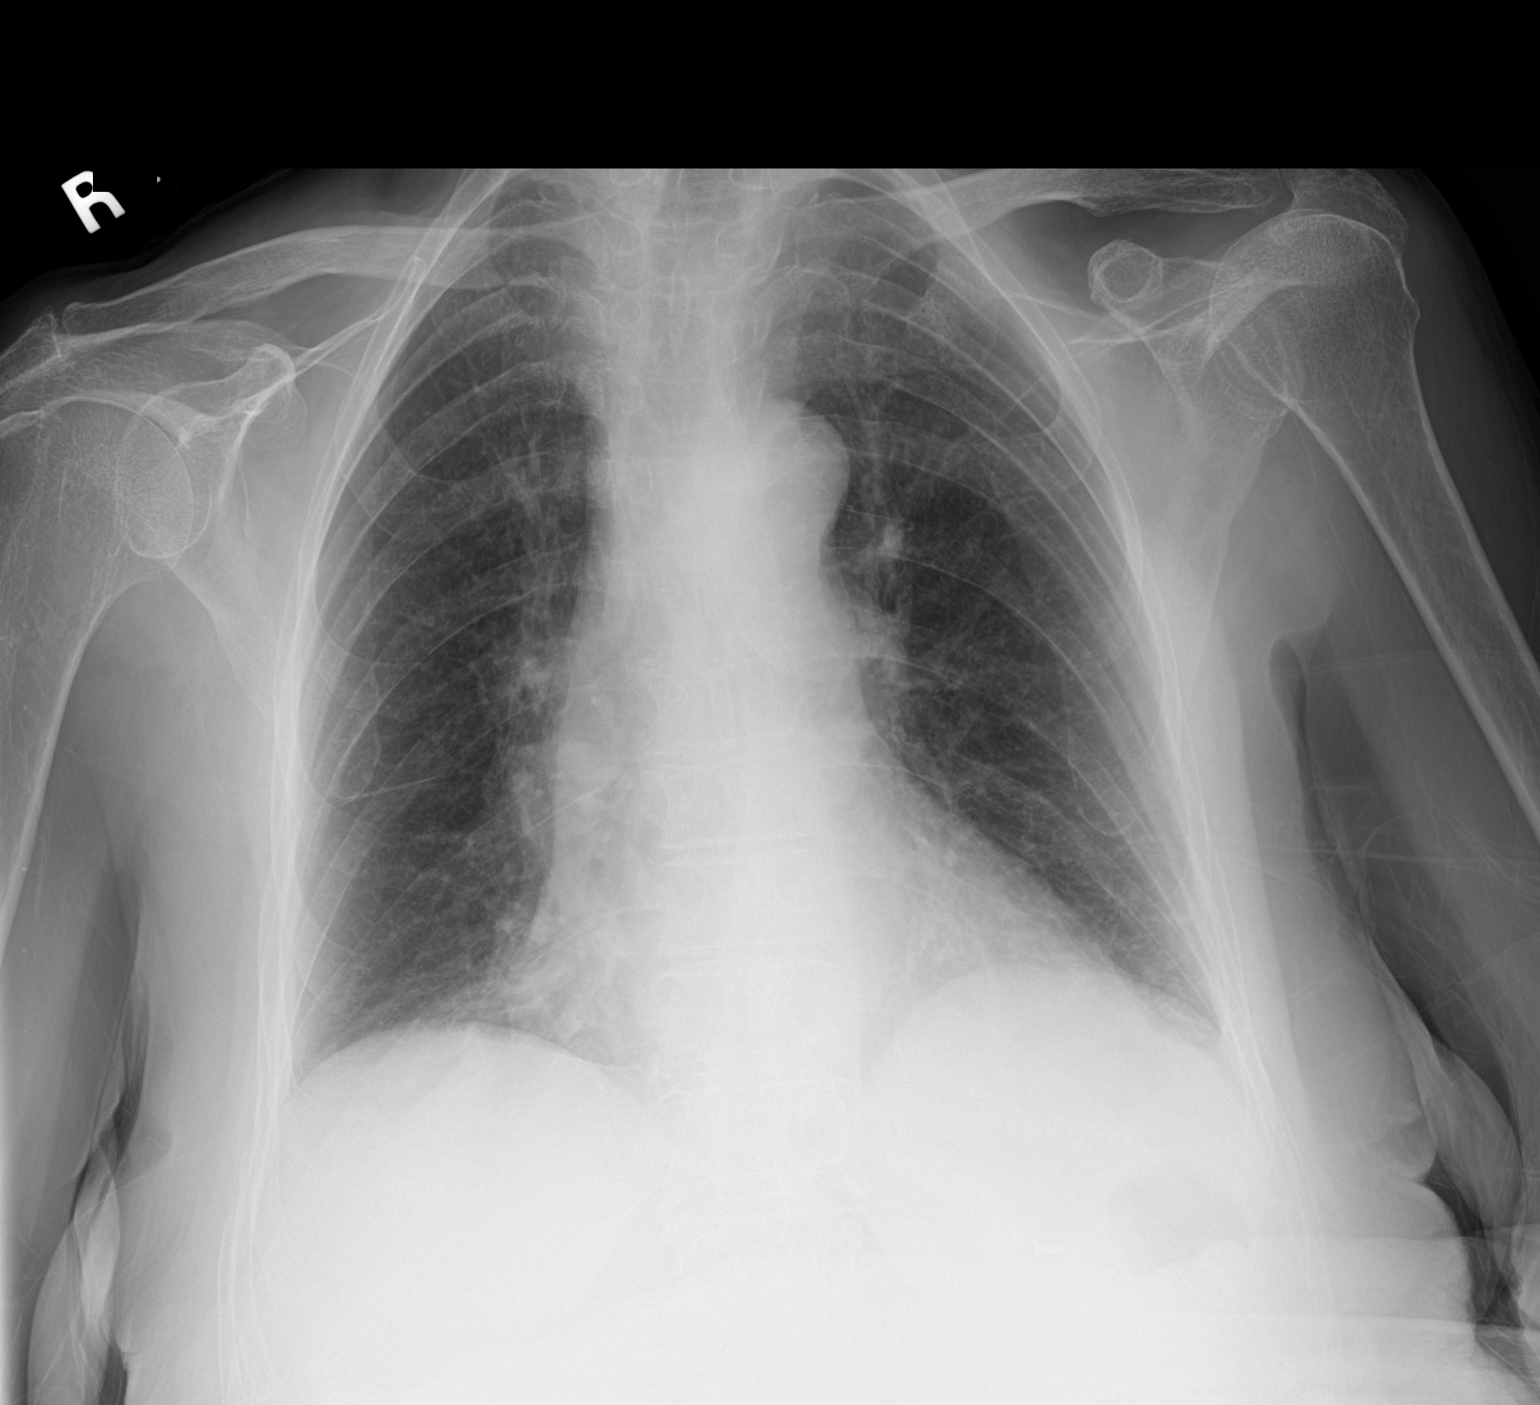

[1 of 1 positions shown; findings below may reference images not displayed]

FINDINGS: Heart is upper normal in size. Mediastinal contours are unchanged
allowing for differences in technique. Improved bibasilar aeration
with minimal residual streaky atelectasis. Pulmonary vasculature is
normal. No consolidation, pleural effusion, or pneumothorax. No
acute osseous abnormalities are seen.
IMPRESSION: No acute abnormality. Improved bibasilar aeration from exam 1 month
prior with minimal residual atelectasis.

## 2018-02-01 IMAGING — DX DG HUMERUS 2V *R*
2 series · 2 of 2 positions shown · non-contrast
Comparison: None.

CLINICAL DATA: Recent fall. Right upper arm pain and bruising.
Initial encounter.

EXAM:
RIGHT HUMERUS - 2+ VIEW

[humerus ap]
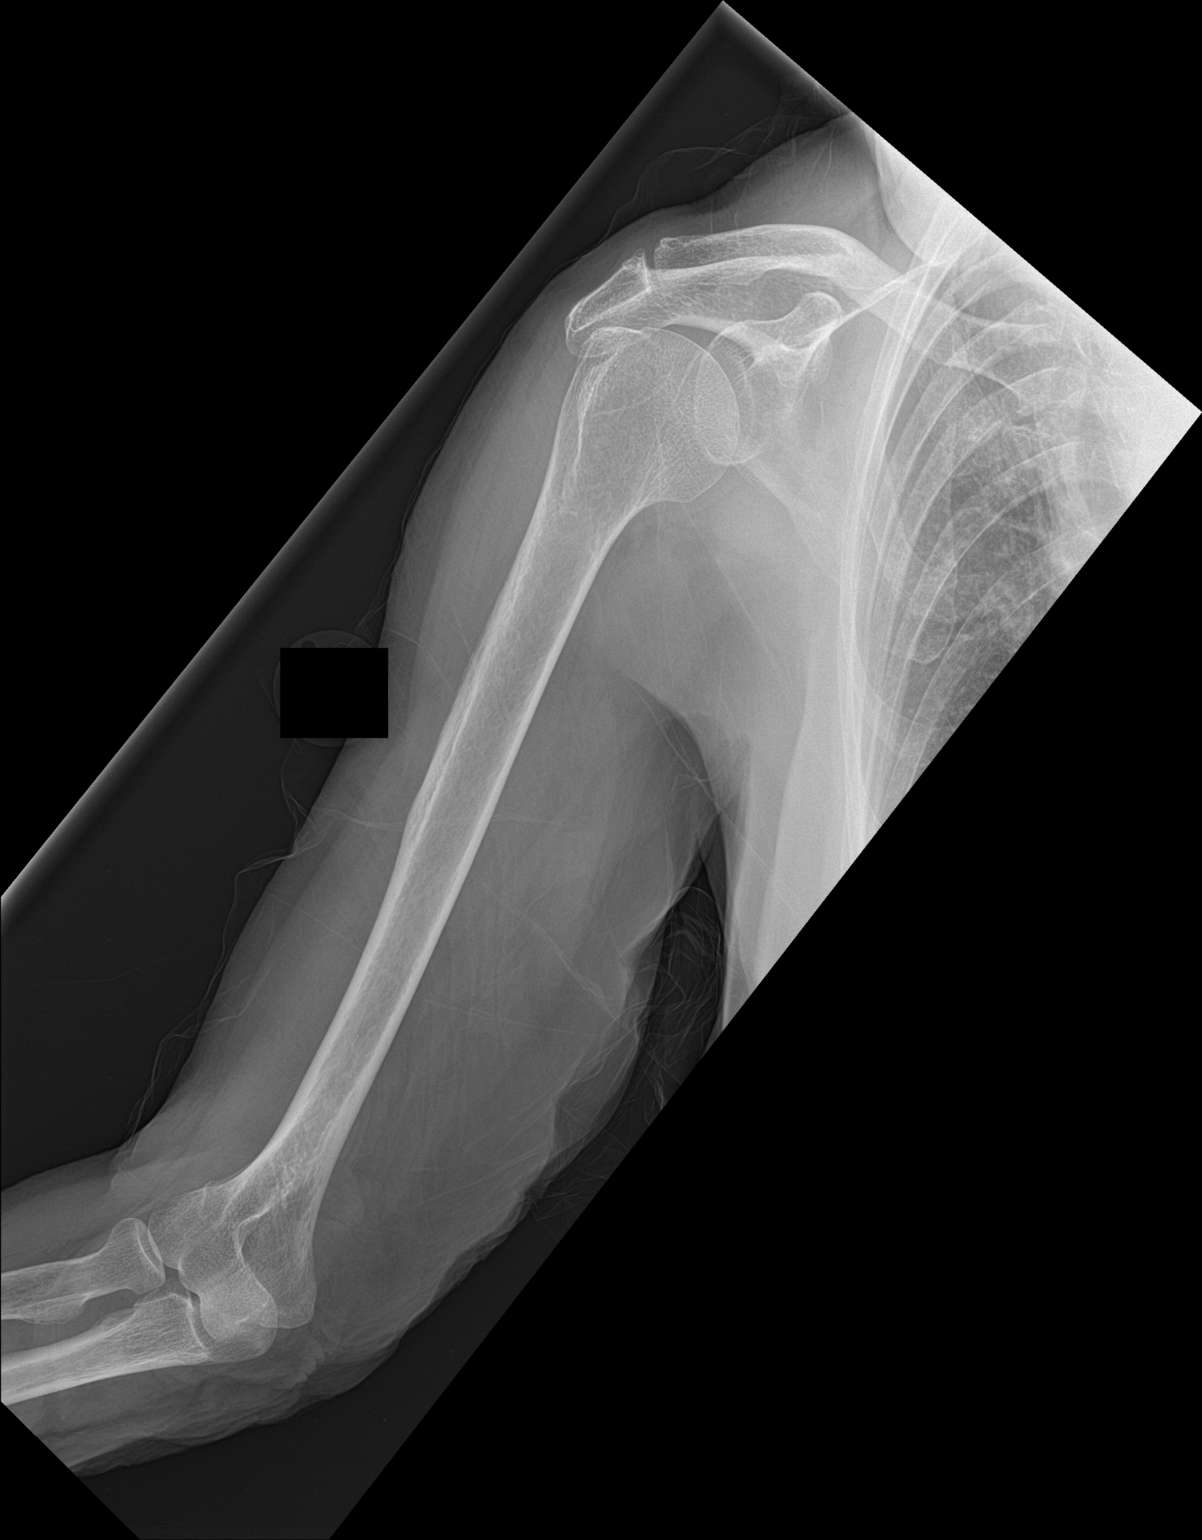

[humerus lat]
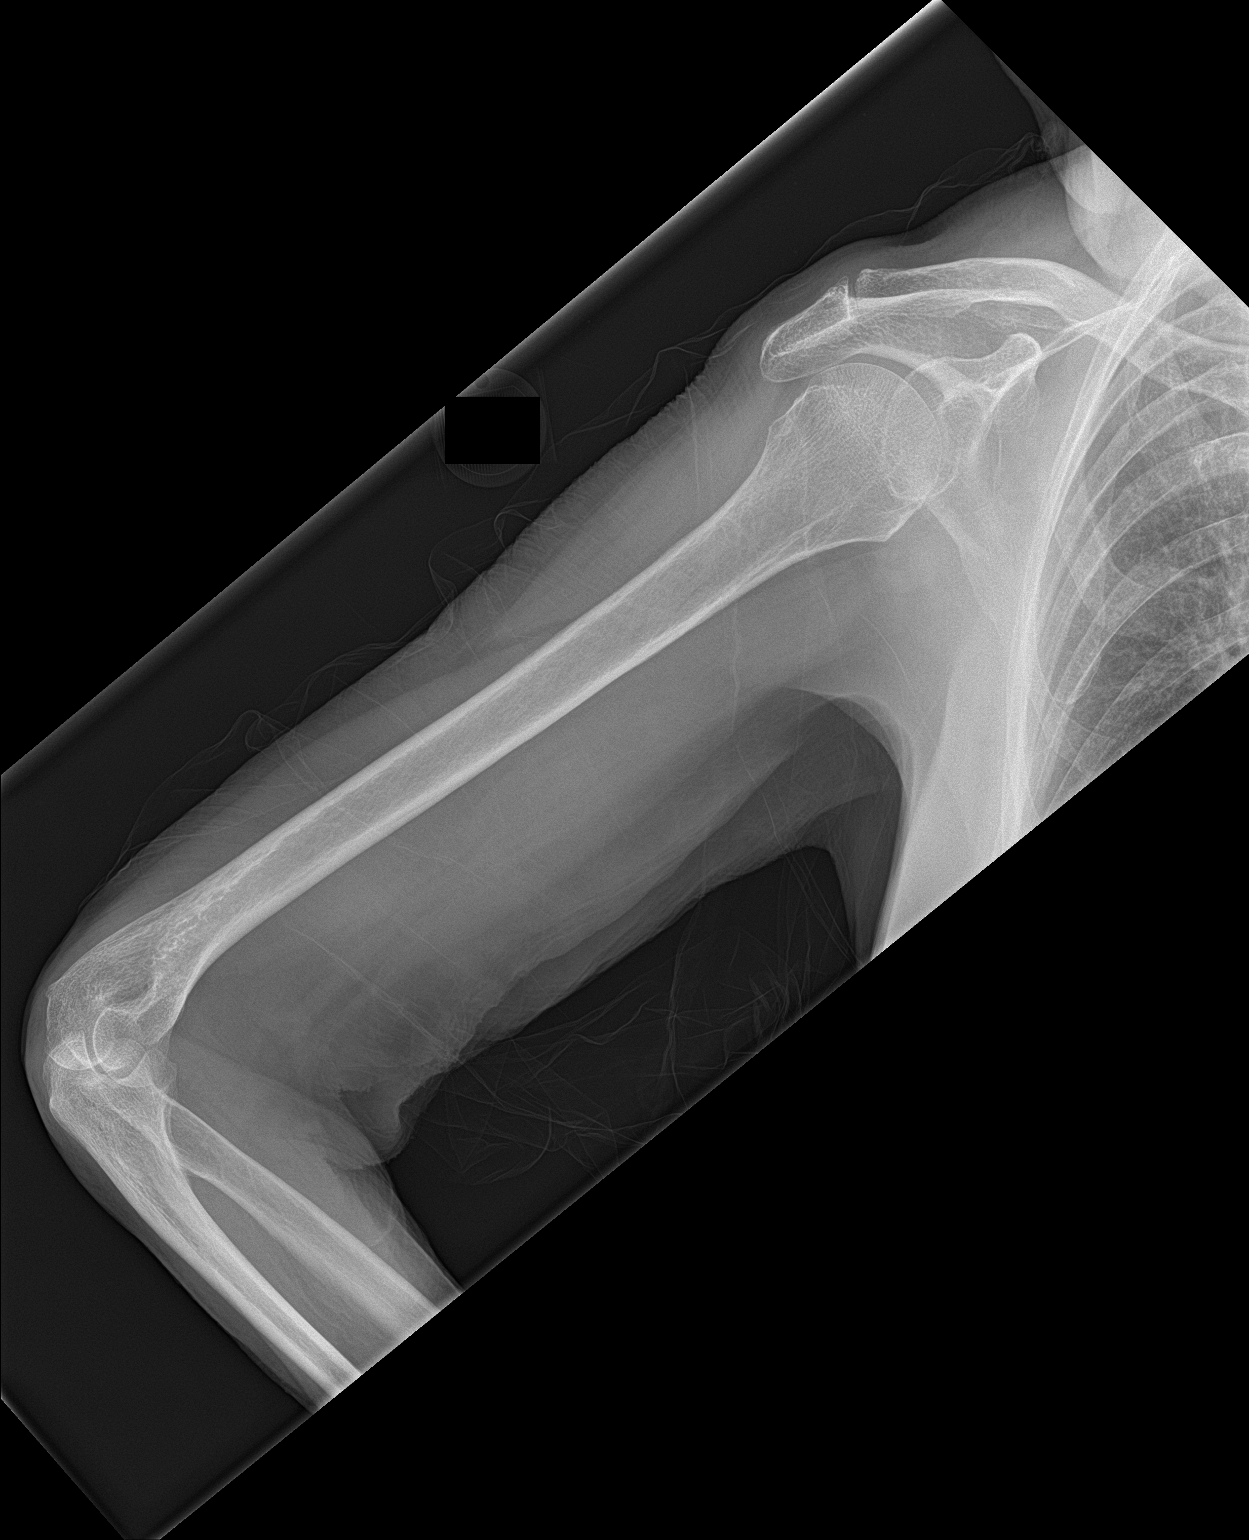

[2 of 2 positions shown; findings below may reference images not displayed]

FINDINGS: There is no evidence of fracture or other focal bone lesions
involving the humerus. Soft tissues are unremarkable.
IMPRESSION: Negative.

## 2018-05-23 ENCOUNTER — Emergency Department: Payer: Medicare (Managed Care)

## 2018-05-23 ENCOUNTER — Inpatient Hospital Stay
Admission: EM | Admit: 2018-05-23 | Discharge: 2018-05-31 | DRG: 871 | Disposition: A | Discharge status: Hospice | Payer: Medicare (Managed Care) | Attending: Internal Medicine | Admitting: Internal Medicine

## 2018-05-23 DIAGNOSIS — Z515 Encounter for palliative care: Secondary | ICD-10-CM | POA: Diagnosis not present

## 2018-05-23 DIAGNOSIS — E119 Type 2 diabetes mellitus without complications: Secondary | ICD-10-CM | POA: Diagnosis present

## 2018-05-23 DIAGNOSIS — F05 Delirium due to known physiological condition: Secondary | ICD-10-CM | POA: Diagnosis present

## 2018-05-23 DIAGNOSIS — R6520 Severe sepsis without septic shock: Secondary | ICD-10-CM | POA: Diagnosis present

## 2018-05-23 DIAGNOSIS — I272 Pulmonary hypertension, unspecified: Secondary | ICD-10-CM | POA: Diagnosis present

## 2018-05-23 DIAGNOSIS — A419 Sepsis, unspecified organism: Secondary | ICD-10-CM

## 2018-05-23 DIAGNOSIS — N390 Urinary tract infection, site not specified: Secondary | ICD-10-CM | POA: Diagnosis present

## 2018-05-23 DIAGNOSIS — Z6825 Body mass index (BMI) 25.0-25.9, adult: Secondary | ICD-10-CM | POA: Diagnosis not present

## 2018-05-23 DIAGNOSIS — I361 Nonrheumatic tricuspid (valve) insufficiency: Secondary | ICD-10-CM | POA: Diagnosis not present

## 2018-05-23 DIAGNOSIS — J1000 Influenza due to other identified influenza virus with unspecified type of pneumonia: Secondary | ICD-10-CM | POA: Diagnosis present

## 2018-05-23 DIAGNOSIS — D638 Anemia in other chronic diseases classified elsewhere: Secondary | ICD-10-CM | POA: Diagnosis present

## 2018-05-23 DIAGNOSIS — A4151 Sepsis due to Escherichia coli [E. coli]: Principal | ICD-10-CM | POA: Diagnosis present

## 2018-05-23 DIAGNOSIS — J9600 Acute respiratory failure, unspecified whether with hypoxia or hypercapnia: Secondary | ICD-10-CM

## 2018-05-23 DIAGNOSIS — E43 Unspecified severe protein-calorie malnutrition: Secondary | ICD-10-CM | POA: Diagnosis present

## 2018-05-23 DIAGNOSIS — Z85828 Personal history of other malignant neoplasm of skin: Secondary | ICD-10-CM

## 2018-05-23 DIAGNOSIS — G9341 Metabolic encephalopathy: Secondary | ICD-10-CM | POA: Diagnosis present

## 2018-05-23 DIAGNOSIS — Z7189 Other specified counseling: Secondary | ICD-10-CM

## 2018-05-23 DIAGNOSIS — I5031 Acute diastolic (congestive) heart failure: Secondary | ICD-10-CM | POA: Diagnosis not present

## 2018-05-23 DIAGNOSIS — G309 Alzheimer's disease, unspecified: Secondary | ICD-10-CM | POA: Diagnosis present

## 2018-05-23 DIAGNOSIS — L8990 Pressure ulcer of unspecified site, unspecified stage: Secondary | ICD-10-CM

## 2018-05-23 DIAGNOSIS — Z66 Do not resuscitate: Secondary | ICD-10-CM | POA: Diagnosis present

## 2018-05-23 DIAGNOSIS — E872 Acidosis: Secondary | ICD-10-CM | POA: Diagnosis present

## 2018-05-23 DIAGNOSIS — G301 Alzheimer's disease with late onset: Secondary | ICD-10-CM | POA: Diagnosis not present

## 2018-05-23 DIAGNOSIS — J9601 Acute respiratory failure with hypoxia: Secondary | ICD-10-CM | POA: Diagnosis not present

## 2018-05-23 DIAGNOSIS — R0602 Shortness of breath: Secondary | ICD-10-CM

## 2018-05-23 DIAGNOSIS — E876 Hypokalemia: Secondary | ICD-10-CM | POA: Diagnosis present

## 2018-05-23 DIAGNOSIS — J111 Influenza due to unidentified influenza virus with other respiratory manifestations: Secondary | ICD-10-CM | POA: Diagnosis not present

## 2018-05-23 DIAGNOSIS — F0281 Dementia in other diseases classified elsewhere with behavioral disturbance: Secondary | ICD-10-CM | POA: Diagnosis not present

## 2018-05-23 DIAGNOSIS — J9801 Acute bronchospasm: Secondary | ICD-10-CM

## 2018-05-23 LAB — COMPREHENSIVE METABOLIC PANEL
ALK PHOS: 72 U/L (ref 38–126)
ALT: 11 U/L (ref 0–44)
AST: 28 U/L (ref 15–41)
Albumin: 3.8 g/dL (ref 3.5–5.0)
Anion gap: 12 (ref 5–15)
BILIRUBIN TOTAL: 0.6 mg/dL (ref 0.3–1.2)
BUN: 22 mg/dL (ref 8–23)
CALCIUM: 8.5 mg/dL — AB (ref 8.9–10.3)
CO2: 20 mmol/L — ABNORMAL LOW (ref 22–32)
CREATININE: 1.1 mg/dL — AB (ref 0.44–1.00)
Chloride: 105 mmol/L (ref 98–111)
GFR, EST AFRICAN AMERICAN: 53 mL/min — AB (ref >60)
GFR, EST NON AFRICAN AMERICAN: 45 mL/min — AB (ref >60)
Glucose, Bld: 142 mg/dL — ABNORMAL HIGH (ref 70–99)
Potassium: 3.6 mmol/L (ref 3.5–5.1)
Sodium: 137 mmol/L (ref 135–145)
TOTAL PROTEIN: 6.6 g/dL (ref 6.5–8.1)

## 2018-05-23 LAB — URINALYSIS, ROUTINE W REFLEX MICROSCOPIC
Bilirubin Urine: NEGATIVE
GLUCOSE, UA: NEGATIVE mg/dL
KETONES UR: 5 mg/dL — AB
NITRITE: POSITIVE — AB
PROTEIN: 30 mg/dL — AB
Specific Gravity, Urine: 1.019 (ref 1.005–1.030)
pH: 5 (ref 5.0–8.0)

## 2018-05-23 LAB — CBC WITH DIFFERENTIAL/PLATELET
Abs Immature Granulocytes: 0.12 10*3/uL — ABNORMAL HIGH (ref 0.00–0.07)
BASOS ABS: 0 10*3/uL (ref 0.0–0.1)
Basophils Relative: 0 %
EOS ABS: 0.1 10*3/uL (ref 0.0–0.5)
EOS PCT: 0 %
HEMATOCRIT: 37.6 % (ref 36.0–46.0)
HEMOGLOBIN: 12 g/dL (ref 12.0–15.0)
Immature Granulocytes: 1 %
LYMPHS PCT: 9 %
Lymphs Abs: 1.7 10*3/uL (ref 0.7–4.0)
MCH: 31.3 pg (ref 26.0–34.0)
MCHC: 31.9 g/dL (ref 30.0–36.0)
MCV: 98.2 fL (ref 80.0–100.0)
MONO ABS: 1 10*3/uL (ref 0.1–1.0)
Monocytes Relative: 5 %
NRBC: 0 % (ref 0.0–0.2)
Neutro Abs: 15.8 10*3/uL — ABNORMAL HIGH (ref 1.7–7.7)
Neutrophils Relative %: 85 %
Platelets: 205 10*3/uL (ref 150–400)
RBC: 3.83 MIL/uL — AB (ref 3.87–5.11)
RDW: 14.4 % (ref 11.5–15.5)
SMEAR REVIEW: NORMAL
WBC: 18.7 10*3/uL — AB (ref 4.0–10.5)

## 2018-05-23 LAB — CG4 I-STAT (LACTIC ACID): Lactic Acid, Venous: 3.6 mmol/L (ref 0.5–1.9)

## 2018-05-23 LAB — LACTIC ACID, PLASMA: Lactic Acid, Venous: 1.8 mmol/L (ref 0.5–1.9)

## 2018-05-23 LAB — TROPONIN I

## 2018-05-23 LAB — INFLUENZA PANEL BY PCR (TYPE A & B)
Influenza A By PCR: POSITIVE — AB
Influenza B By PCR: NEGATIVE

## 2018-05-23 LAB — GLUCOSE, CAPILLARY: Glucose-Capillary: 166 mg/dL — ABNORMAL HIGH (ref 70–99)

## 2018-05-23 MED ORDER — ONDANSETRON HCL 4 MG/2ML IJ SOLN: 4.0000 mg | Freq: Four times a day (QID) | INTRAMUSCULAR | Status: DC | PRN

## 2018-05-23 MED ORDER — METHYLPREDNISOLONE SODIUM SUCC 125 MG IJ SOLR
125.0000 mg | Freq: Once | INTRAMUSCULAR | Status: AC
  Administered 2018-05-23: 125 mg via INTRAVENOUS
  Filled 2018-05-23: qty 2

## 2018-05-23 MED ORDER — ACETAMINOPHEN 325 MG PO TABS: 650.0000 mg | ORAL_TABLET | Freq: Four times a day (QID) | ORAL | Status: DC | PRN

## 2018-05-23 MED ORDER — IPRATROPIUM-ALBUTEROL 0.5-2.5 (3) MG/3ML IN SOLN
9.0000 mL | Freq: Once | RESPIRATORY_TRACT | Status: AC
  Administered 2018-05-23: 9 mL via RESPIRATORY_TRACT
  Filled 2018-05-23: qty 3

## 2018-05-23 MED ORDER — LORAZEPAM 2 MG/ML IJ SOLN
2.0000 mg | INTRAMUSCULAR | Status: DC | PRN
  Filled 2018-05-23: qty 1

## 2018-05-23 MED ORDER — SODIUM CHLORIDE 0.9 % IV SOLN
500.0000 mg | INTRAVENOUS | Status: DC
  Administered 2018-05-23: 500 mg via INTRAVENOUS
  Filled 2018-05-23 (×2): qty 500

## 2018-05-23 MED ORDER — SODIUM CHLORIDE 0.9 % IV SOLN
1.0000 g | INTRAVENOUS | Status: DC
  Filled 2018-05-23: qty 10

## 2018-05-23 MED ORDER — OSELTAMIVIR PHOSPHATE 75 MG PO CAPS
75.0000 mg | ORAL_CAPSULE | Freq: Once | ORAL | Status: AC
  Administered 2018-05-23: 75 mg via ORAL
  Filled 2018-05-23: qty 1

## 2018-05-23 MED ORDER — ENOXAPARIN SODIUM 40 MG/0.4ML ~~LOC~~ SOLN
40.0000 mg | SUBCUTANEOUS | Status: DC
  Administered 2018-05-23 – 2018-05-28 (×6): 40 mg via SUBCUTANEOUS
  Filled 2018-05-23 (×6): qty 0.4

## 2018-05-23 MED ORDER — SODIUM CHLORIDE 0.9 % IV BOLUS
1000.0000 mL | Freq: Once | INTRAVENOUS | Status: AC
  Administered 2018-05-23: 1000 mL via INTRAVENOUS

## 2018-05-23 MED ORDER — DOCUSATE SODIUM 100 MG PO CAPS
100.0000 mg | ORAL_CAPSULE | Freq: Two times a day (BID) | ORAL | Status: DC
  Administered 2018-05-23 – 2018-05-27 (×2): 100 mg via ORAL
  Filled 2018-05-23 (×6): qty 1

## 2018-05-23 MED ORDER — ONDANSETRON HCL 4 MG PO TABS: 4.0000 mg | ORAL_TABLET | Freq: Four times a day (QID) | ORAL | Status: DC | PRN

## 2018-05-23 MED ORDER — OSELTAMIVIR PHOSPHATE 75 MG PO CAPS
75.0000 mg | ORAL_CAPSULE | Freq: Two times a day (BID) | ORAL | Status: DC
  Administered 2018-05-23 – 2018-05-24 (×2): 75 mg via ORAL
  Filled 2018-05-23 (×2): qty 1

## 2018-05-23 MED ORDER — TRAZODONE HCL 50 MG PO TABS
25.0000 mg | ORAL_TABLET | Freq: Every evening | ORAL | Status: DC | PRN
  Administered 2018-05-25: 25 mg via ORAL
  Filled 2018-05-23: qty 1

## 2018-05-23 MED ORDER — SODIUM CHLORIDE 0.9 % IV SOLN
500.0000 mg | INTRAVENOUS | Status: DC
  Filled 2018-05-23: qty 500

## 2018-05-23 MED ORDER — ACETAMINOPHEN 650 MG RE SUPP
650.0000 mg | Freq: Four times a day (QID) | RECTAL | Status: DC | PRN
  Administered 2018-05-28: 650 mg via RECTAL
  Filled 2018-05-23: qty 1

## 2018-05-23 MED ORDER — DIVALPROEX SODIUM 125 MG PO CSDR
250.0000 mg | DELAYED_RELEASE_CAPSULE | Freq: Two times a day (BID) | ORAL | Status: DC
  Administered 2018-05-23 – 2018-05-24 (×2): 250 mg via ORAL
  Filled 2018-05-23 (×3): qty 2

## 2018-05-23 MED ORDER — SODIUM CHLORIDE 0.9 % IV SOLN
2.0000 g | INTRAVENOUS | Status: DC
  Administered 2018-05-23: 2 g via INTRAVENOUS
  Filled 2018-05-23 (×2): qty 20

## 2018-05-23 MED ORDER — BISACODYL 5 MG PO TBEC: 5.0000 mg | DELAYED_RELEASE_TABLET | Freq: Every day | ORAL | Status: DC | PRN

## 2018-05-23 MED ORDER — INSULIN ASPART 100 UNIT/ML ~~LOC~~ SOLN
0.0000 [IU] | Freq: Three times a day (TID) | SUBCUTANEOUS | Status: DC
  Administered 2018-05-26: 2 [IU] via SUBCUTANEOUS
  Administered 2018-05-26: 1 [IU] via SUBCUTANEOUS
  Filled 2018-05-23 (×2): qty 1

## 2018-05-23 MED ORDER — SODIUM CHLORIDE 0.9 % IV SOLN
INTRAVENOUS | Status: DC
  Administered 2018-05-23 – 2018-05-25 (×3): via INTRAVENOUS

## 2018-05-23 MED ORDER — OLANZAPINE 2.5 MG PO TABS
2.5000 mg | ORAL_TABLET | Freq: Two times a day (BID) | ORAL | Status: DC
  Administered 2018-05-23 – 2018-05-24 (×2): 2.5 mg via ORAL
  Filled 2018-05-23 (×3): qty 1

## 2018-05-23 NOTE — ED Triage Notes (Signed)
Sent to ed from home with c/o of decreased appetitive and change in mental status x 3 days.

## 2018-05-23 NOTE — ED Notes (Signed)
Daughter contact # [phone]

## 2018-05-23 NOTE — ED Notes (Signed)
ED TO INPATIENT HANDOFF REPORT  Name/Age/Gender Suzanne Contreras 85 y.o. female  Code Status    Code Status Orders  (From admission, onward)         Start     Ordered   05/23/18 1448  Do not attempt resuscitation (DNR)  Continuous    Question Answer Comment  In the event of cardiac or respiratory ARREST Do not call a "code blue"   In the event of cardiac or respiratory ARREST Do not perform Intubation, CPR, defibrillation or ACLS   In the event of cardiac or respiratory ARREST Use medication by any route, position, wound care, and other measures to relive pain and suffering. May use oxygen, suction and manual treatment of airway obstruction as needed for comfort.   Comments NMP      05/23/18 1449        Code Status History    Date Active Date Inactive Code Status Order ID Comments User Context   03/15/2017 0149 03/17/2017 1722 DNR [id number]  [name], MD Inpatient   12/10/2016 1942 12/14/2016 2121 Partial Code [id number]  [name], RN Inpatient   12/10/2016 1317 12/10/2016 1942 Full Code [id number]  [name], MD Inpatient   12/10/2016 1221 12/10/2016 1317 Partial Code [id number] Discussed with patient's daughter, Mrs. Shoe today [name], MD ED      [place]  Chief Complaint ams  Level of Care/Admitting Diagnosis ED Disposition    ED Disposition Condition [place]: [place] [100120]  Level of Care: Telemetry [5]  Diagnosis: Sepsis [facility]) [[id number]]  Admitting Physician: [name] [[id number]]  Attending Physician: [name] [phone]  Estimated length of stay: past midnight tomorrow  Certification:: I certify this patient will need inpatient services for at least 2 midnights  PT Class (Do Not Modify): Inpatient [101]  PT Acc Code (Do Not Modify): Private [1]       Medical History Past Medical History:  Diagnosis Date  . Dementia ([place])   . Diabetes mellitus without complication ([place])    . Skin cancer     Allergies Allergies  Allergen Reactions  . Penicillins     IV Location/Drains/Wounds Patient Lines/Drains/Airways Status   Active Line/Drains/Airways    Name:   Placement date:   Placement time:   Site:   Days:   Peripheral IV 05/23/18 Right Forearm   05/23/18    1334    Forearm   less than 1   Peripheral IV 05/23/18 Left Forearm   05/23/18    1408    Forearm   less than 1          Labs/Imaging Results for orders placed or performed during the hospital encounter of 05/23/18 (from the past 48 hour(s))  Comprehensive metabolic panel     Status: Abnormal   Collection Time: 05/23/18 12:45 PM  Result Value Ref Range   Sodium 137 135 - 145 mmol/L   Potassium 3.6 3.5 - 5.1 mmol/L   Chloride 105 98 - 111 mmol/L   CO2 20 (L) 22 - 32 mmol/L   Glucose, Bld 142 (H) 70 - 99 mg/dL   BUN 22 8 - 23 mg/dL   Creatinine, Ser 1.10 (H) 0.44 - 1.00 mg/dL   Calcium 8.5 (L) 8.9 - 10.3 mg/dL   Total Protein 6.6 6.5 - 8.1 g/dL   Albumin 3.8 3.5 - 5.0 g/dL   AST 28 15 - 41 U/L   ALT 11 0 - 44 U/L  Alkaline Phosphatase 72 38 - 126 U/L   Total Bilirubin 0.6 0.3 - 1.2 mg/dL   GFR calc non Af Amer 45 (L) >60 mL/min   GFR calc Af Amer 53 (L) >60 mL/min   Anion gap 12 5 - 15    Comment: Performed at [facility], [address] [place], [place] [address]  CBC WITH DIFFERENTIAL     Status: Abnormal   Collection Time: 05/23/18 12:45 PM  Result Value Ref Range   WBC 18.7 (H) 4.0 - 10.5 K/uL   RBC 3.83 (L) 3.87 - 5.11 MIL/uL   Hemoglobin 12.0 12.0 - 15.0 g/dL   HCT 37.6 36.0 - 46.0 %   MCV 98.2 80.0 - 100.0 fL   MCH 31.3 26.0 - 34.0 pg   MCHC 31.9 30.0 - 36.0 g/dL   RDW 14.4 11.5 - 15.5 %   Platelets 205 150 - 400 K/uL   nRBC 0.0 0.0 - 0.2 %   Neutrophils Relative % 85 %   Neutro Abs 15.8 (H) 1.7 - 7.7 K/uL   Lymphocytes Relative 9 %   Lymphs Abs 1.7 0.7 - 4.0 K/uL   Monocytes Relative 5 %   Monocytes Absolute 1.0 0.1 - 1.0 K/uL   Eosinophils Relative 0 %    Eosinophils Absolute 0.1 0.0 - 0.5 K/uL   Basophils Relative 0 %   Basophils Absolute 0.0 0.0 - 0.1 K/uL   WBC Morphology MORPHOLOGY UNREMARKABLE    RBC Morphology MORPHOLOGY UNREMARKABLE    Smear Review Normal platelet morphology    Immature Granulocytes 1 %   Abs Immature Granulocytes 0.12 (H) 0.00 - 0.07 K/uL    Comment: Performed at [facility], [address] [place], [place] [address]  Urinalysis, Routine w reflex microscopic     Status: Abnormal   Collection Time: 05/23/18 12:45 PM  Result Value Ref Range   Color, Urine YELLOW (A) YELLOW   APPearance HAZY (A) CLEAR   Specific Gravity, Urine 1.019 1.005 - 1.030   pH 5.0 5.0 - 8.0   Glucose, UA NEGATIVE NEGATIVE mg/dL   Hgb urine dipstick SMALL (A) NEGATIVE   Bilirubin Urine NEGATIVE NEGATIVE   Ketones, ur 5 (A) NEGATIVE mg/dL   Protein, ur 30 (A) NEGATIVE mg/dL   Nitrite POSITIVE (A) NEGATIVE   Leukocytes, UA SMALL (A) NEGATIVE   RBC / HPF 0-5 0 - 5 RBC/hpf   WBC, UA >50 (H) 0 - 5 WBC/hpf   Bacteria, UA RARE (A) NONE SEEN   Squamous Epithelial / LPF 0-5 0 - 5   WBC Clumps PRESENT    Mucus PRESENT     Comment: Performed at [facility], [address] [place], [place] [address]  Troponin I - ONCE - STAT     Status: None   Collection Time: 05/23/18 12:45 PM  Result Value Ref Range   Troponin I <0.03 <0.03 ng/mL    Comment: Performed at [facility], [address] [place], [place] [address]  Influenza panel by PCR (type A & B)     Status: Abnormal   Collection Time: 05/23/18 12:46 PM  Result Value Ref Range   Influenza A By PCR POSITIVE (A) NEGATIVE   Influenza B By PCR NEGATIVE NEGATIVE    Comment: (NOTE) The Xpert Xpress Flu assay is intended as an aid in the diagnosis of  influenza and should not be used as a sole basis for treatment.  This  assay is FDA approved for nasopharyngeal swab  specimens only. Nasal  washings and aspirates are unacceptable for Xpert Xpress Flu  testing. Performed at [facility], [place], [place] [address]   CG4 I-STAT (Lactic acid)     Status: Abnormal   Collection Time: 05/23/18  1:50 PM  Result Value Ref Range   Lactic Acid, Venous 3.60 (HH) 0.5 - 1.9 mmol/L   Comment NOTIFIED PHYSICIAN    Dg Chest 1 View  Result Date: 05/23/2018 CLINICAL DATA:  Decreased appetite.  Mental status changes. EXAM: CHEST  1 VIEW COMPARISON:  03/14/2017 FINDINGS: Moderate cardiomegaly. Normal vascularity. Bibasilar patchy opacities left greater than right are not significantly changed. No pneumothorax or pleural effusion. IMPRESSION: Bibasilar opacities are unchanged compatible with atelectasis for scarring. Cardiomegaly without decompensation. Electronically Signed   By: Marybelle Killings M.D.   On: 05/23/2018 13:03    Pending Labs Unresulted Labs (From admission, onward)    Start     Ordered   05/30/18 0500  Creatinine, serum  (enoxaparin (LOVENOX)    CrCl >/= 30 ml/min)  Weekly,   STAT    Comments:  while on enoxaparin therapy    05/23/18 1449   05/24/18 1740  Basic metabolic panel  Tomorrow morning,   STAT     05/23/18 1449   05/24/18 0500  CBC  Tomorrow morning,   STAT     05/23/18 1449   05/23/18 1448  Urine culture  Add-on,   AD     05/23/18 1449   05/23/18 1447  CBC  (enoxaparin (LOVENOX)    CrCl >/= 30 ml/min)  Once,   STAT    Comments:  Baseline for enoxaparin therapy IF NOT ALREADY DRAWN.  Notify MD if PLT < 100 K.    05/23/18 1449   05/23/18 1447  Creatinine, serum  (enoxaparin (LOVENOX)    CrCl >/= 30 ml/min)  Once,   STAT    Comments:  Baseline for enoxaparin therapy IF NOT ALREADY DRAWN.    05/23/18 1449   05/23/18 1240  Urine culture  ONCE - STAT,   STAT     05/23/18 1239   05/23/18 1239  Blood Culture (routine x 2)  BLOOD CULTURE X 2,   STAT     05/23/18 1239          Vitals/Pain Today's Vitals   05/23/18 1240 05/23/18 1241 05/23/18 1330  BP: (!) 142/120    Pulse: (!) 133    Resp: (!) 26     Temp:   (!) 102.7 F (39.3 C)  TempSrc:   Rectal  SpO2: 92%    PainSc:  0-No pain     Isolation Precautions Droplet precaution  Medications Medications  cefTRIAXone (ROCEPHIN) 2 g in sodium chloride 0.9 % 100 mL IVPB (0 g Intravenous Stopped 05/23/18 1407)  azithromycin (ZITHROMAX) 500 mg in sodium chloride 0.9 % 250 mL IVPB (0 mg Intravenous Stopped 05/23/18 1532)  oseltamivir (TAMIFLU) capsule 75 mg (has no administration in time range)  OLANZapine (ZYPREXA) tablet 2.5 mg (has no administration in time range)  divalproex (DEPAKOTE SPRINKLE) capsule 250 mg (has no administration in time range)  0.9 %  sodium chloride infusion ( Intravenous New Bag/Given 05/23/18 1534)  acetaminophen (TYLENOL) tablet 650 mg (has no administration in time range)    Or  acetaminophen (TYLENOL) suppository 650 mg (has no administration in time range)  traZODone (DESYREL) tablet 25 mg (has no administration in time range)  docusate sodium (COLACE) capsule 100 mg (has no administration in time  range)  bisacodyl (DULCOLAX) EC tablet 5 mg (has no administration in time range)  ondansetron (ZOFRAN) tablet 4 mg (has no administration in time range)    Or  ondansetron (ZOFRAN) injection 4 mg (has no administration in time range)  enoxaparin (LOVENOX) injection 40 mg (has no administration in time range)  oseltamivir (TAMIFLU) capsule 75 mg (has no administration in time range)  cefTRIAXone (ROCEPHIN) 1 g in sodium chloride 0.9 % 100 mL IVPB (has no administration in time range)  azithromycin (ZITHROMAX) 500 mg in sodium chloride 0.9 % 250 mL IVPB (has no administration in time range)  LORazepam (ATIVAN) injection 2 mg (has no administration in time range)  methylPREDNISolone sodium succinate (SOLU-MEDROL) 125 mg/2 mL injection 125 mg (125 mg Intravenous Given 05/23/18 1332)  ipratropium-albuterol (DUONEB) 0.5-2.5 (3) MG/3ML nebulizer solution 9 mL (9 mLs Nebulization Given 05/23/18 1359)  sodium chloride 0.9 % bolus  1,000 mL (0 mLs Intravenous Stopped 05/23/18 1503)    Mobility Stretcher

## 2018-05-23 NOTE — ED Notes (Signed)
I-stat lactate 3.6

## 2018-05-23 NOTE — H&P (Signed)
[facility] [place] NAME: Suzanne Contreras    MR#:  [id number]  DATE OF BIRTH:  Sep 13, 1929  DATE OF ADMISSION:  05/23/2018  PRIMARY CARE PHYSICIAN: Raford Pitcher, MD   REQUESTING/REFERRING PHYSICIAN: Dr. Clearnce Hasten  CHIEF COMPLAINT: Altered mental status   Chief Complaint  Patient presents with  . Altered Mental Status    HISTORY OF PRESENT ILLNESS:  Suzanne Contreras  is a 85 y.o. female with a known history of Alzheimer's dementia, admitted by EMS because of hypoxia with O2 sats 88% on room air, confusion.  History obtained from ER charts.  Patient very confused, unable to give any history now.  Found to have UTI, pneumonia on blood work.  She is now tachycardic with heart rate up to 130 bpm, she has sepsis with elevated lactic acid to 3.60 due to UTI, pneumonia.  Admitting for the same. PAST MEDICAL HISTORY:   Past Medical History:  Diagnosis Date  . Dementia ([place])   . Diabetes mellitus without complication ([place])   . Skin cancer     PAST SURGICAL HISTOIRY:  History reviewed. No pertinent surgical history.  SOCIAL HISTORY:   Social History   Tobacco Use  . Smoking status: Never Smoker  . Smokeless tobacco: Never Used  Substance Use Topics  . Alcohol use: No    FAMILY HISTORY:  History reviewed. No pertinent family history.  DRUG ALLERGIES:   Allergies  Allergen Reactions  . Penicillins     REVIEW OF SYSTEMS:  Unable to obtain review of systems because of altered mental status.  MEDICATIONS AT HOME:   Prior to Admission medications   Medication Sig Start Date End Date Taking? Authorizing Provider  divalproex (DEPAKOTE SPRINKLE) 125 MG capsule Take 2 capsules (250 mg total) by mouth 2 (two) times daily. 03/17/17   Max Sane, MD  OLANZapine (ZYPREXA) 2.5 MG tablet Take 1 tablet (2.5 mg total) by mouth 2 (two) times daily. 03/17/17   Max Sane, MD      VITAL SIGNS:  Blood pressure (!) 142/120, pulse (!)  133, temperature (!) 102.7 F (39.3 C), temperature source Rectal, resp. rate (!) 26, SpO2 92 %.  PHYSICAL EXAMINATION:  GENERAL:  85 y.o.-year-old patient lying in the bed, confused EYES: Pupils equal, round, reactive to light . No scleral icterus. Extraocular muscles intact.  HEENT: Head atraumatic, normocephalic. Oropharynx and nasopharynx clear.  NECK:  Supple, no jugular venous distention. No thyroid enlargement, no tenderness.  LUNGS: Normal breath sounds bilaterally, no wheezing, rales,rhonchi or crepitation. No use of accessory muscles of respiration.  CARDIOVASCULAR: S1, S2 tachycardic no murmurs, rubs, or gallops.  ABDOMEN: Soft, nontender, nondistended. Bowel sounds present. No organomegaly or mass.  EXTREMITIES: No pedal edema, cyanosis, or clubbing.  NEUROLOGIC: Unable to obtain full neuro exam because of altered mental status patient is very confused and trying to pull bed sheets. PSYCHIATRIC: The patient is very confused SKIN: No obvious rash, lesion, or ulcer.   LABORATORY PANEL:   CBC Recent Labs  Lab 05/23/18 1245  WBC 18.7*  HGB 12.0  HCT 37.6  PLT 205   ------------------------------------------------------------------------------------------------------------------  Chemistries  Recent Labs  Lab 05/23/18 1245  NA 137  K 3.6  CL 105  CO2 20*  GLUCOSE 142*  BUN 22  CREATININE 1.10*  CALCIUM 8.5*  AST 28  ALT 11  ALKPHOS 72  BILITOT 0.6   ------------------------------------------------------------------------------------------------------------------  Cardiac Enzymes Recent Labs  Lab 05/23/18 1245  TROPONINI <0.03   ------------------------------------------------------------------------------------------------------------------  RADIOLOGY:  Dg Chest 1 View  Result Date: 05/23/2018 CLINICAL DATA:  Decreased appetite.  Mental status changes. EXAM: CHEST  1 VIEW COMPARISON:  03/14/2017 FINDINGS: Moderate cardiomegaly. Normal vascularity.  Bibasilar patchy opacities left greater than right are not significantly changed. No pneumothorax or pleural effusion. IMPRESSION: Bibasilar opacities are unchanged compatible with atelectasis for scarring. Cardiomegaly without decompensation. Electronically Signed   By: Marybelle Killings M.D.   On: 05/23/2018 13:03    EKG:   Orders placed or performed during the hospital encounter of 05/23/18  . ED EKG 12-Lead  . ED EKG 12-Lead  Sinus tachycardia with 132 bpm  IMPRESSION AND PLAN:  85 year old female with advanced Alzheimer's dementia comes from home because of worsening confusion now found to have sepsis due to influenza type B, pneumonia, UTI.  1. sepsis present on admission due to influenza type A, UTI: Continue Tamiflu for 5 days.  Rocephin, follow urine cultures, follow-up on lactic acid.  #2. sinus tachycardia secondary to sepsis, agitation, continue fluids, use Ativan as needed. 3.  Severe advanced dementia with agitation, continue her home meds including Risperdal, Depakote, consult palliative care.  Followed by [facility] palliative care team, CODE STATUS DNR. #4 recent skin biopsy for possible skin cancer, follows up with [facility] dermatology. Prognosis poor due to underlying dementia now with sepsis, influenza, UTI. All the records are reviewed and case discussed with ED provider. Management plans discussed with the patient, family and they are in agreement.  CODE STATUS: DNR  TOTAL TIME TAKING CARE OF THIS PATIENT: 50 minutes.    [name] M.D on 05/23/2018 at 3:18 PM  Between 7am to 6pm - Pager - [phone]  After 6pm go to [email or web address] - password [email or web address] [place] [facility]  Office  [phone]  CC: Primary care physician; Raford Pitcher, MD  Note: This dictation was prepared with Dragon dictation along with smaller phrase technology. Any transcriptional errors that result from this process are unintentional. e aggressive hydration, follow-up on  lactic acid, follow blood cultures. 2.  CVS and tachycardia secondary to

## 2018-05-23 NOTE — ED Provider Notes (Signed)
[facility] Emergency Department Provider Note  ____________________________________________   First MD Initiated Contact with Patient 05/23/18 1238     (approximate)  I have reviewed the triage vital signs and the nursing notes.   HISTORY  Chief Complaint Altered Mental Status   HPI Suzanne Contreras is a 85 y.o. female with a history of dementia as well as diabetes who is presenting to the emergency department today with agitation as well as fever.  EMS also reports that initially the patient was 88% on room air and she was placed on an albuterol nebulizer.  Also with a strong sense of urine per EMS when they arrived.  Patient also arrives with a DNR as well as most form that indicates to proceed with treatment is appropriate including antibiotics.  Patient unable to give further history at this time.   Past Medical History:  Diagnosis Date  . Dementia ([place])   . Diabetes mellitus without complication ([place])   . Skin cancer     Patient Active Problem List   Diagnosis Date Noted  . Fall 03/14/2017  . Altered mental status 12/13/2016  . Confusion 12/12/2016  . Acute delirium 12/10/2016  . UTI (urinary tract infection) 12/10/2016  . Vaginal lump 10/22/2016  . Hypothyroidism due to acquired atrophy of thyroid 02/20/2016  . Late onset Alzheimer's disease with behavioral disturbance ([place]) 11/21/2015    History reviewed. No pertinent surgical history.  Prior to Admission medications   Medication Sig Start Date End Date Taking? Authorizing Provider  divalproex (DEPAKOTE SPRINKLE) 125 MG capsule Take 2 capsules (250 mg total) by mouth 2 (two) times daily. 03/17/17   Max Sane, MD  OLANZapine (ZYPREXA) 2.5 MG tablet Take 1 tablet (2.5 mg total) by mouth 2 (two) times daily. 03/17/17   Max Sane, MD    Allergies Penicillins  History reviewed. No pertinent family history.  Social History Social History   Tobacco Use  . Smoking status: Never  Smoker  . Smokeless tobacco: Never Used  Substance Use Topics  . Alcohol use: No  . Drug use: No    Review of Systems  Level 5 caveat secondary to dementia.   ____________________________________________   PHYSICAL EXAM:  VITAL SIGNS: ED Triage Vitals  Enc Vitals Group     BP 05/23/18 1240 (!) 142/120     Pulse Rate 05/23/18 1240 (!) 133     Resp 05/23/18 1240 (!) 26     Temp 05/23/18 1330 (!) 102.7 F (39.3 C)     Temp Source 05/23/18 1330 Rectal     SpO2 05/23/18 1240 92 %     Weight --      Height --      Head Circumference --      Peak Flow --      Pain Score 05/23/18 1241 0     Pain Loc --      Pain Edu? --      Excl. in [place]? --     Constitutional: Alert and oriented to name only.  Cooperative with treatment but gets distracted very easily. Eyes: Conjunctivae are normal.  Head: Atraumatic. Nose: No congestion/rhinnorhea. Mouth/Throat: Mucous membranes are moist.  Neck: No stridor.   Cardiovascular: Tachycardic, regular rhythm. Grossly normal heart sounds.   Respiratory: Tachypneic with diffuse wheezing and a prolonged expiratory phase. Gastrointestinal: Soft and nontender. No distention.  Musculoskeletal: No lower extremity tenderness nor edema.  No joint effusions. Neurologic:  Normal speech and language. No gross focal neurologic deficits  are appreciated. Skin:  Skin is warm, dry and intact. No rash noted. Psychiatric: Mood and affect are normal. Speech and behavior are normal.  ____________________________________________   LABS (all labs ordered are listed, but only abnormal results are displayed)  Labs Reviewed  COMPREHENSIVE METABOLIC PANEL - Abnormal; Notable for the following components:      Result Value   CO2 20 (*)    Glucose, Bld 142 (*)    Creatinine, Ser 1.10 (*)    Calcium 8.5 (*)    GFR calc non Af Amer 45 (*)    GFR calc Af Amer 53 (*)    All other components within normal limits  CBC WITH DIFFERENTIAL/PLATELET - Abnormal; Notable  for the following components:   WBC 18.7 (*)    RBC 3.83 (*)    Neutro Abs 15.8 (*)    Abs Immature Granulocytes 0.12 (*)    All other components within normal limits  URINALYSIS, ROUTINE W REFLEX MICROSCOPIC - Abnormal; Notable for the following components:   Color, Urine YELLOW (*)    APPearance HAZY (*)    Hgb urine dipstick SMALL (*)    Ketones, ur 5 (*)    Protein, ur 30 (*)    Nitrite POSITIVE (*)    Leukocytes, UA SMALL (*)    WBC, UA >50 (*)    Bacteria, UA RARE (*)    All other components within normal limits  INFLUENZA PANEL BY PCR (TYPE A & B) - Abnormal; Notable for the following components:   Influenza A By PCR POSITIVE (*)    All other components within normal limits  CG4 I-STAT (LACTIC ACID) - Abnormal; Notable for the following components:   Lactic Acid, Venous 3.60 (*)    All other components within normal limits  CULTURE, BLOOD (ROUTINE X 2)  CULTURE, BLOOD (ROUTINE X 2)  URINE CULTURE  TROPONIN I  I-STAT CG4 LACTIC ACID, ED  I-STAT CG4 LACTIC ACID, ED   ____________________________________________  EKG  ED ECG REPORT I, Doran Stabler, the attending physician, personally viewed and interpreted this ECG.   Date: 05/23/2018  EKG Time: 1248  Rate: 132  Rhythm: sinus tachycardia  Axis: Normal  Intervals:Borderline prolonged QTC at 497  ST&T Change: No ST segment elevation or depression.  T wave inversions in aVL.  ____________________________________________  RADIOLOGY  Bibasilar opacities are unchanged and compatible with atelectasis or scarring. ____________________________________________   PROCEDURES  Procedure(s) performed:   .Critical Care Performed by: Orbie Pyo, MD Authorized by: Orbie Pyo, MD   Critical care provider statement:    Critical care time (minutes):  35   Critical care time was exclusive of:  Separately billable procedures and treating other patients   Critical care was necessary to  treat or prevent imminent or life-threatening deterioration of the following conditions:  Sepsis   Critical care was time spent personally by me on the following activities:  Development of treatment plan with patient or surrogate, discussions with consultants, evaluation of patient's response to treatment, examination of patient, obtaining history from patient or surrogate, ordering and performing treatments and interventions, ordering and review of laboratory studies, ordering and review of radiographic studies, pulse oximetry, re-evaluation of patient's condition and review of old charts    Critical Care performed:    ____________________________________________   INITIAL IMPRESSION / [place] / ED COURSE  Pertinent labs & imaging results that were available during my care of the patient were reviewed by me and considered in my  medical decision making (see chart for details).  Differential diagnosis includes, but is not limited to, alcohol, illicit or prescription medications, or other toxic ingestion; intracranial pathology such as stroke or intracerebral hemorrhage; fever or infectious causes including sepsis; hypoxemia and/or hypercarbia; uremia; trauma; endocrine related disorders such as diabetes, hypoglycemia, and thyroid-related diseases; hypertensive encephalopathy; etc. As part of my medical decision making, I reviewed the following data within the electronic MEDICAL RECORD NUMBER Notes from prior ED visits  ----------------------------------------- 2:33 PM on 05/23/2018 -----------------------------------------  Patient is flu a positive as well as appearing to have a urinary tract infection.  Antibiotics ordered.  Patient will also receive Tamiflu.  Plan on admission to the hospital.  Discussed case Dr. Darvin Neighbours.  Patient also receiving fluids. ____________________________________________   FINAL CLINICAL IMPRESSION(S) / ED DIAGNOSES  Sepsis.  Influenza.  UTI.   NEW  MEDICATIONS STARTED DURING THIS VISIT:  New Prescriptions   No medications on file     Note:  This document was prepared using Dragon voice recognition software and may include unintentional dictation errors.     Orbie Pyo, MD 05/23/18 1434

## 2018-05-23 NOTE — ED Notes (Signed)
Report given to receiving nurse. Will send a tech to pick patient up.

## 2018-05-23 NOTE — Progress Notes (Signed)
Oxygen sats on room air 86%. Started on O2 [place] 2L

## 2018-05-23 NOTE — Progress Notes (Signed)
CODE SEPSIS - PHARMACY COMMUNICATION  **Broad Spectrum Antibiotics should be administered within 1 hour of Sepsis diagnosis**  Time Code Sepsis Called/Page Received: 05/23/18 @1244   Antibiotics Ordered: ceftriaxone/azithromycin  Time of 1st antibiotic administration: 05/23/18 @1334   Additional action taken by pharmacy: none  Oswald Hillock ,PharmD, BCPS Clinical Pharmacist  05/23/2018  12:43 PM

## 2018-05-23 NOTE — Progress Notes (Signed)
Unable to complete admission database.  The patient is confused and lethargic and no family members are present at this time.

## 2018-05-24 LAB — CBC
HCT: 35 % — ABNORMAL LOW (ref 36.0–46.0)
Hemoglobin: 11 g/dL — ABNORMAL LOW (ref 12.0–15.0)
MCH: 30.1 pg (ref 26.0–34.0)
MCHC: 31.4 g/dL (ref 30.0–36.0)
MCV: 95.6 fL (ref 80.0–100.0)
Platelets: 205 10*3/uL (ref 150–400)
RBC: 3.66 MIL/uL — ABNORMAL LOW (ref 3.87–5.11)
RDW: 14.6 % (ref 11.5–15.5)
WBC: 16 10*3/uL — ABNORMAL HIGH (ref 4.0–10.5)
nRBC: 0 % (ref 0.0–0.2)

## 2018-05-24 LAB — GLUCOSE, CAPILLARY
Glucose-Capillary: 83 mg/dL (ref 70–99)
Glucose-Capillary: 86 mg/dL (ref 70–99)
Glucose-Capillary: 93 mg/dL (ref 70–99)
Glucose-Capillary: 82 mg/dL (ref 70–99)

## 2018-05-24 LAB — BASIC METABOLIC PANEL
Anion gap: 7 (ref 5–15)
BUN: 22 mg/dL (ref 8–23)
CO2: 18 mmol/L — ABNORMAL LOW (ref 22–32)
CREATININE: 0.84 mg/dL (ref 0.44–1.00)
Calcium: 7.8 mg/dL — ABNORMAL LOW (ref 8.9–10.3)
Chloride: 114 mmol/L — ABNORMAL HIGH (ref 98–111)
GFR calc Af Amer: >60 mL/min (ref >60)
GFR calc non Af Amer: >60 mL/min (ref >60)
Glucose, Bld: 101 mg/dL — ABNORMAL HIGH (ref 70–99)
Potassium: 3.8 mmol/L (ref 3.5–5.1)
Sodium: 139 mmol/L (ref 135–145)

## 2018-05-24 MED ORDER — HALOPERIDOL LACTATE 5 MG/ML IJ SOLN
1.0000 mg | Freq: Four times a day (QID) | INTRAMUSCULAR | Status: DC | PRN
  Administered 2018-05-24: 1 mg via INTRAMUSCULAR
  Filled 2018-05-24: qty 1

## 2018-05-24 MED ORDER — HALOPERIDOL 1 MG PO TABS
1.0000 mg | ORAL_TABLET | Freq: Once | ORAL | Status: AC
  Filled 2018-05-24: qty 1

## 2018-05-24 MED ORDER — HALOPERIDOL 1 MG PO TABS
1.0000 mg | ORAL_TABLET | Freq: Four times a day (QID) | ORAL | Status: DC | PRN
  Filled 2018-05-24: qty 1

## 2018-05-24 MED ORDER — HALOPERIDOL 2 MG PO TABS
2.0000 mg | ORAL_TABLET | Freq: Four times a day (QID) | ORAL | Status: DC | PRN
  Filled 2018-05-24: qty 1

## 2018-05-24 MED ORDER — OSELTAMIVIR PHOSPHATE 30 MG PO CAPS
30.0000 mg | ORAL_CAPSULE | Freq: Two times a day (BID) | ORAL | Status: AC
  Administered 2018-05-25 – 2018-05-27 (×3): 30 mg via ORAL
  Filled 2018-05-24 (×9): qty 1

## 2018-05-24 MED ORDER — SODIUM CHLORIDE 0.9 % IV SOLN
1.0000 g | INTRAVENOUS | Status: DC
  Administered 2018-05-24: 1 g via INTRAVENOUS
  Filled 2018-05-24: qty 1
  Filled 2018-05-24: qty 10

## 2018-05-24 MED ORDER — HALOPERIDOL LACTATE 5 MG/ML IJ SOLN
1.0000 mg | Freq: Once | INTRAMUSCULAR | Status: AC
  Administered 2018-05-24: 1 mg via INTRAVENOUS
  Filled 2018-05-24: qty 1

## 2018-05-24 MED ORDER — SODIUM CHLORIDE 0.9 % IV SOLN
500.0000 mg | INTRAVENOUS | Status: DC
  Administered 2018-05-24: 500 mg via INTRAVENOUS
  Filled 2018-05-24 (×2): qty 500

## 2018-05-24 MED ORDER — SODIUM CHLORIDE 0.9 % IV SOLN: 1.0000 g | INTRAVENOUS | Status: DC

## 2018-05-24 MED ORDER — SODIUM CHLORIDE 0.9 % IV SOLN: 500.0000 mg | INTRAVENOUS | Status: DC

## 2018-05-24 MED ORDER — HALOPERIDOL LACTATE 5 MG/ML IJ SOLN
2.0000 mg | Freq: Four times a day (QID) | INTRAMUSCULAR | Status: DC | PRN
  Administered 2018-05-24 – 2018-05-25 (×2): 2 mg via INTRAMUSCULAR
  Filled 2018-05-24 (×2): qty 1

## 2018-05-24 NOTE — Progress Notes (Addendum)
[facility] NAME: Suzanne Contreras    MR#:  [id number]  DATE OF BIRTH:  [birth year]  SUBJECTIVE:  Patient is demented per baseline, case discussed with the patient's son with all questions answered   REVIEW OF SYSTEMS:  Review of Systems  Unable to perform ROS: Dementia    DRUG ALLERGIES:   Allergies  Allergen Reactions  . Penicillins    VITALS:  Blood pressure (!) 128/100, pulse (!) 104, temperature (!) 97.5 F (36.4 C), temperature source Oral, resp. rate 20, height 5' (1.524 m), weight 60.1 kg, SpO2 95 %. PHYSICAL EXAMINATION:  Physical Exam HENT:     Head: Normocephalic.  Eyes:     General: No scleral icterus.    Conjunctiva/sclera: Conjunctivae normal.  Neck:     Musculoskeletal: Neck supple.     Vascular: No JVD.     Trachea: No tracheal deviation.  Cardiovascular:     Rate and Rhythm: Normal rate and regular rhythm.     Heart sounds: Normal heart sounds. No murmur. No gallop.   Pulmonary:     Effort: Pulmonary effort is normal. No respiratory distress.     Breath sounds: Normal breath sounds. No wheezing or rales.  Abdominal:     General: Bowel sounds are normal. There is no distension.     Palpations: Abdomen is soft.     Tenderness: There is no abdominal tenderness. There is no rebound.  Musculoskeletal:        General: No tenderness.     Right lower leg: No edema.     Left lower leg: No edema.  Skin:    Findings: No erythema or rash.  Neurological:     Mental Status: She is alert.     Cranial Nerves: No cranial nerve deficit.     Comments: Unable to exam.  Psychiatric:     Comments: Confused and agitated.    LABORATORY PANEL:  Female CBC Recent Labs  Lab 05/24/18 0659  WBC 16.0*  HGB 11.0*  HCT 35.0*  PLT 205   ------------------------------------------------------------------------------------------------------------------ Chemistries  Recent Labs  Lab 05/23/18 1245 05/24/18 0659  NA  137 139  K 3.6 3.8  CL 105 114*  CO2 20* 18*  GLUCOSE 142* 101*  BUN 22 22  CREATININE 1.10* 0.84  CALCIUM 8.5* 7.8*  AST 28  --   ALT 11  --   ALKPHOS 72  --   BILITOT 0.6  --    RADIOLOGY:  No results found. ASSESSMENT AND PLAN:  85 year old female with advanced Alzheimer's dementia comes from home because of worsening confusion now found to have sepsis due to influenza type B, pneumonia, UTI.  *acute sepsis Resolving Secondary to influenza type A, E. coli UTI Continue sepsis protocol, urine cultures noted for E. coli, follow-up on outstanding cultures  *Acute E. coli UTI  Resolving  Change antibiotics to Ancef given pansensitive E. coli on culture   *Acute influenza type a  Resolving  Continue supportive care, Tamiflu twice daily   *Lactic acidosis  Secondary to above  Resolved with IV fluids for rehydration   *Acute sinus tachycardia  secondary to sepsis as well as agitation from dementia Continue IV fluids for rehydration, antibiotics per above, supportive care for dementia    *Chronic severe advanced dementia with agitation Continue increased nursing care PRN, aspiration/fall/skin care precautions while in house, continue Risperdal, Depakote, palliative care consulted given long-term poor prognosis, Haldol as needed   *Recent skin  cancer  Followed by [facility] dermatology-we will need follow-up status post discharge for continued care    All the records are reviewed and case discussed with Care Management/Social Worker. Management plans discussed with the patient, family and they are in agreement.  CODE STATUS: DNR  TOTAL TIME TAKING CARE OF THIS PATIENT: 35 minutes.   More than 50% of the time was spent in counseling/coordination of care: YES  POSSIBLE D/C IN 2 DAYS, DEPENDING ON CLINICAL CONDITION.   Demetrios Loll M.D on 05/24/2018 at 2:13 PM  Between 7am to 6pm - Pager - [phone]  After 6pm go to [email or web address] - Research scientist (physical sciences) Hospitalists

## 2018-05-24 NOTE — Care Management Note (Signed)
Case Management Note  Patient Details  Name: Suzanne Contreras MRN: [id number] Date of Birth: [birth year]  Subjective/Objective:       Patient is from home with daughter.  She has been admitted with UTI and is flu(+).  History of advanced Alzheimer's with recent worsening confusion. Family is not at bedside at this time.  RNCM consult ordered for home health needs.  This RNCM will speak with patient's daughter when she returns.  Patient has been combative and agitated throughout the day.                 Action/Plan:   Expected Discharge Date:                  Expected Discharge Plan:  [place]  In-House Referral:     Discharge planning Services  CM Consult  Post Acute Care Choice:    Choice offered to:     DME Arranged:    DME Agency:     HH Arranged:    [place] Agency:     Status of Service:  In process, will continue to follow  If discussed at Long Length of Stay Meetings, dates discussed:    Additional Comments:  Elza Rafter, RN 05/24/2018, 4:02 PM

## 2018-05-24 NOTE — Progress Notes (Addendum)
Patient has not voided all day. Bladder scan revealed 310 ml. MD notified, orders to monitor patient. Patient still agitated and combative, new orders for 1:1 sitter for patient. Moving patient closer to the nurses station.    Update: patient now in 51- CCMD called to update patient room. Patient more calm now.

## 2018-05-24 NOTE — Progress Notes (Addendum)
Patient agitated, pulling on telemetry monitoring and has pulled out her IV. Refused some of her medications but has no swallowing issues. Patient attempting to get out of bed and pulling on lines and clothing. Patient only alert to self. Dr. Bridgett Larsson aware- PRN orders put in.   PRN medication did no help, patient still agitated. Pulling off her gown. Still need to put an IV in for medications. Verbal orders by Dr. Bridgett Larsson to give another dose of haldol (1mg ) IM.   Update: RN attempted twice for an IV and was unsuccessful. IV team consulted. Waiting for IV team to assess patient- currently unable to give any IV medications. PRN haldol given again intramuscular. Low bed ordered and requested to be moved closer to the nurses station when a clean room is available.

## 2018-05-24 NOTE — Progress Notes (Signed)
PHARMACY NOTE:  ANTIMICROBIAL RENAL DOSAGE ADJUSTMENT  Current antimicrobial regimen includes a mismatch between antimicrobial dosage and estimated renal function.  As per policy approved by the Pharmacy & Therapeutics and Medical Executive Committees, the antimicrobial dosage will be adjusted accordingly.  Current antimicrobial dosage:  Tamiflu 75mg  bid  Indication: flu +  Renal Function:  Estimated Creatinine Clearance: 38.9 mL/min (by C-G formula based on SCr of 0.84 mg/dL). []      On intermittent HD, scheduled: []      On CRRT    Antimicrobial dosage has been changed to:  Tamiflu 30mg  bid  Additional comments:   Thank you for allowing pharmacy to be a part of this patient's care.  Vira Blanco, [facility] 05/24/2018 1:09 PM

## 2018-05-25 DIAGNOSIS — Z515 Encounter for palliative care: Secondary | ICD-10-CM

## 2018-05-25 DIAGNOSIS — Z7189 Other specified counseling: Secondary | ICD-10-CM

## 2018-05-25 DIAGNOSIS — A419 Sepsis, unspecified organism: Secondary | ICD-10-CM

## 2018-05-25 LAB — URINE CULTURE: Culture: >=100000 — AB

## 2018-05-25 LAB — CBC
HCT: 32.4 % — ABNORMAL LOW (ref 36.0–46.0)
HEMOGLOBIN: 10.5 g/dL — AB (ref 12.0–15.0)
MCH: 30.8 pg (ref 26.0–34.0)
MCHC: 32.4 g/dL (ref 30.0–36.0)
MCV: 95 fL (ref 80.0–100.0)
Platelets: 207 10*3/uL (ref 150–400)
RBC: 3.41 MIL/uL — ABNORMAL LOW (ref 3.87–5.11)
RDW: 14.6 % (ref 11.5–15.5)
WBC: 12.1 10*3/uL — ABNORMAL HIGH (ref 4.0–10.5)
nRBC: 0 % (ref 0.0–0.2)

## 2018-05-25 LAB — GLUCOSE, CAPILLARY
GLUCOSE-CAPILLARY: 78 mg/dL (ref 70–99)
Glucose-Capillary: 73 mg/dL (ref 70–99)
Glucose-Capillary: 73 mg/dL (ref 70–99)
Glucose-Capillary: 67 mg/dL — ABNORMAL LOW (ref 70–99)
Glucose-Capillary: 112 mg/dL — ABNORMAL HIGH (ref 70–99)

## 2018-05-25 LAB — MRSA PCR SCREENING: MRSA by PCR: NEGATIVE

## 2018-05-25 MED ORDER — DEXTROSE 50 % IV SOLN
INTRAVENOUS | Status: AC
  Filled 2018-05-25: qty 50

## 2018-05-25 MED ORDER — DEXTROSE 50 % IV SOLN
12.5000 g | Freq: Once | INTRAVENOUS | Status: AC
  Administered 2018-05-25: 12.5 g via INTRAVENOUS

## 2018-05-25 MED ORDER — DEXTROSE 10 % IV SOLN
INTRAVENOUS | Status: DC
  Administered 2018-05-26 – 2018-05-29 (×3): via INTRAVENOUS

## 2018-05-25 MED ORDER — CEFAZOLIN SODIUM-DEXTROSE 2-4 GM/100ML-% IV SOLN
2.0000 g | Freq: Three times a day (TID) | INTRAVENOUS | Status: DC
  Administered 2018-05-25 – 2018-05-26 (×4): 2 g via INTRAVENOUS
  Filled 2018-05-25 (×7): qty 100

## 2018-05-25 NOTE — Progress Notes (Signed)
Patient lying in the bed at this time, alert and confused, poor po intake, family at bedside,bil l mittens in place to keep patient from pulling  iv line/  Sitter at bedside

## 2018-05-25 NOTE — Plan of Care (Signed)
  Problem: Safety: Goal: Ability to remain free from injury will improve Outcome: Progressing   Problem: Clinical Measurements: Goal: Signs and symptoms of infection will decrease Outcome: Progressing   Problem: Respiratory: Goal: Ability to maintain adequate ventilation will improve Outcome: Progressing

## 2018-05-25 NOTE — Consult Note (Signed)
Consultation Note Date: 05/25/2018   Patient Name: Suzanne Contreras  DOB: 31-Mar-1930  MRN: [id number]  Age / Sex: 85 y.o., female  PCP: Raford Pitcher, MD Referring Physician: [name], MD  Reason for Consultation: Establishing goals of care  HPI/Patient Profile: 85 y.o. female  with past medical history of dementia, DM, and skin cancer admitted on 05/23/2018 with AMS and decreased PO intake. Found to have lactic acid of 3.6 and diagnosed with sepsis. Urine culture positive for E. Coli. Also found to be positive for influenza A. PMT consulted for [place].  Clinical Assessment and Goals of Care: I have reviewed medical records including EPIC notes, labs and imaging, received report from RN, assessed the patient and then met spoke with her DIL  to discuss diagnosis prognosis, GOC, EOL wishes, disposition and options.  Per chart review, patient's son in legal guardian. I called number listed in chart and his wife, patient's DIL, answered. She tells me that she and patient's son live in [place]. I asked about calling patients daughter and she tells me that patient's son would prefer that we speak with him before speaking to patient's daughter. Patient's son was at work and unavailable to speak. I asked her to have him call me soon and provided my contact information. Patient's son did not return call.   Patient likely eligible for hospice services - depending on hospital course and goals of care, patient may be eligible for hospice facility. PMT will continue to work with family and make attempts to speak with patient's son regarding [place].   Primary Decision Maker NEXT OF KIN - son Kohana Amble    SUMMARY OF RECOMMENDATIONS    - PMT to continue to reach out to family regarding [place] - per chart review son is legal guardian but lives in [place] - patient likely hospice eligible and possibly hospice facility eligible depending on clinical course and  goals of care  Code Status/Advance Care Planning:  DNR    Palliative Prophylaxis:   Aspiration, Delirium Protocol, Frequent Pain Assessment and Turn Reposition  Prognosis:   Unable to determine  Discharge Planning: To Be Determined      Primary Diagnoses: Present on Admission: . Sepsis ([place])   I have reviewed the medical record, interviewed the patient and family, and examined the patient. The following aspects are pertinent.  Past Medical History:  Diagnosis Date  . Dementia ([place])   . Diabetes mellitus without complication ([place])   . Skin cancer    Social History   Socioeconomic History  . Marital status: Married    Spouse name: Not on file  . Number of children: Not on file  . Years of education: Not on file  . Highest education level: Not on file  Occupational History  . Not on file  Social Needs  . Financial resource strain: Not on file  . Food insecurity:    Worry: Not on file    Inability: Not on file  . Transportation needs:    Medical: Not on file    Non-medical: Not on file  Tobacco Use  . Smoking status: Never Smoker  . Smokeless tobacco: Never Used  Substance and Sexual Activity  . Alcohol use: No  . Drug use: No  . Sexual activity: Never  Lifestyle  . Physical activity:    Days per week: Not on file    Minutes per session: Not on file  . Stress: Not on file  Relationships  . Social connections:  Talks on phone: Not on file    Gets together: Not on file    Attends religious service: Not on file    Active member of club or organization: Not on file    Attends meetings of clubs or organizations: Not on file    Relationship status: Not on file  Other Topics Concern  . Not on file  Social History Narrative  . Not on file   History reviewed. No pertinent family history. Scheduled Meds: . docusate sodium  100 mg Oral BID  . enoxaparin (LOVENOX) injection  40 mg Subcutaneous Q24H  . insulin aspart  0-9 Units Subcutaneous TID WC  .  oseltamivir  30 mg Oral BID   Continuous Infusions: . sodium chloride 100 mL/hr at 05/25/18 0019  .  ceFAZolin (ANCEF) IV 2 g (05/25/18 1037)   PRN Meds:.acetaminophen **OR** acetaminophen, bisacodyl, haloperidol **OR** haloperidol lactate, LORazepam, ondansetron **OR** ondansetron (ZOFRAN) IV, traZODone Allergies  Allergen Reactions  . Penicillins    Review of Systems  Unable to perform ROS: Dementia    Physical Exam Constitutional:      General: She is not in acute distress.    Interventions: Nasal cannula in place.  HENT:     Head: Normocephalic and atraumatic.  Cardiovascular:     Rate and Rhythm: Regular rhythm. Tachycardia present.  Pulmonary:     Effort: Pulmonary effort is normal.  Skin:    General: Skin is warm and dry.  Neurological:     Mental Status: She is lethargic and disoriented.  Psychiatric:        Cognition and Memory: Cognition is impaired. Memory is impaired.     Vital Signs: BP 96/76 (BP Location: Right Arm)   Pulse (!) 124   Temp 98.2 F (36.8 C) (Oral)   Resp (!) 21   Ht 5' (1.524 m)   Wt 60.1 kg   SpO2 94%   BMI 25.88 kg/m  Pain Scale: PAINAD   Pain Score: 0-No pain   SpO2: SpO2: 94 % O2 Device:SpO2: 94 % O2 Flow Rate: .O2 Flow Rate (L/min): 2 L/min  IO: Intake/output summary:   Intake/Output Summary (Last 24 hours) at 05/25/2018 1349 Last data filed at 05/25/2018 1047 Gross per 24 hour  Intake 410 ml  Output 0 ml  Net 410 ml    LBM: Last BM Date: (Pt. confused) Baseline Weight: Weight: 59.7 kg Most recent weight: Weight: 60.1 kg     Palliative Assessment/Data: PPS 20%   Time Total: 30 minutes Greater than 50%  of this time was spent counseling and coordinating care related to the above assessment and plan.  Juel Burrow, DNP, AGNP-C Palliative Medicine Team [phone] Pager: [phone]

## 2018-05-25 NOTE — Progress Notes (Signed)
Talked to Dr. Jannifer Franklin about patient's blood sugar at 78, patient is not eating or drinking. Patient has NS running but asked if we can change it with dextrose, order for D10 to run at 50 ml/hr, because patient has congestion. RN will continue to monitor.

## 2018-05-26 ENCOUNTER — Inpatient Hospital Stay (HOSPITAL_COMMUNITY)
Admit: 2018-05-26 | Discharge: 2018-05-26 | Disposition: A | Discharge status: Home / Self Care | Payer: Medicare (Managed Care) | Attending: Pulmonary Disease | Admitting: Pulmonary Disease

## 2018-05-26 ENCOUNTER — Inpatient Hospital Stay: Payer: Medicare (Managed Care)

## 2018-05-26 DIAGNOSIS — J111 Influenza due to unidentified influenza virus with other respiratory manifestations: Secondary | ICD-10-CM

## 2018-05-26 DIAGNOSIS — G309 Alzheimer's disease, unspecified: Secondary | ICD-10-CM

## 2018-05-26 DIAGNOSIS — F0281 Dementia in other diseases classified elsewhere with behavioral disturbance: Secondary | ICD-10-CM

## 2018-05-26 DIAGNOSIS — Z515 Encounter for palliative care: Secondary | ICD-10-CM

## 2018-05-26 DIAGNOSIS — Z7189 Other specified counseling: Secondary | ICD-10-CM

## 2018-05-26 DIAGNOSIS — G301 Alzheimer's disease with late onset: Secondary | ICD-10-CM

## 2018-05-26 DIAGNOSIS — I361 Nonrheumatic tricuspid (valve) insufficiency: Secondary | ICD-10-CM

## 2018-05-26 LAB — BLOOD GAS, ARTERIAL
Acid-base deficit: 6 mmol/L — ABNORMAL HIGH (ref 0.0–2.0)
Acid-base deficit: 0.8 mmol/L (ref 0.0–2.0)
Bicarbonate: 20.7 mmol/L (ref 20.0–28.0)
Bicarbonate: 23.5 mmol/L (ref 20.0–28.0)
Delivery systems: POSITIVE
EXPIRATORY PAP: 12
FIO2: 1
FIO2: 1
Inspiratory PAP: 16
Mechanical Rate: 20
O2 Saturation: 82.2 %
O2 Saturation: 87.9 %
PO2 ART: 54 mmHg — AB (ref 83.0–108.0)
Patient temperature: 37
Patient temperature: 37
RATE: 8 resp/min
pCO2 arterial: 44 mmHg (ref 32.0–48.0)
pCO2 arterial: 37 mmHg (ref 32.0–48.0)
pH, Arterial: 7.28 — ABNORMAL LOW (ref 7.350–7.450)
pH, Arterial: 7.41 (ref 7.350–7.450)
pO2, Arterial: 53 mmHg — ABNORMAL LOW (ref 83.0–108.0)

## 2018-05-26 LAB — GLUCOSE, CAPILLARY
GLUCOSE-CAPILLARY: 108 mg/dL — AB (ref 70–99)
Glucose-Capillary: 128 mg/dL — ABNORMAL HIGH (ref 70–99)
Glucose-Capillary: 138 mg/dL — ABNORMAL HIGH (ref 70–99)
Glucose-Capillary: 99 mg/dL (ref 70–99)
Glucose-Capillary: 149 mg/dL — ABNORMAL HIGH (ref 70–99)
Glucose-Capillary: 186 mg/dL — ABNORMAL HIGH (ref 70–99)
Glucose-Capillary: 112 mg/dL — ABNORMAL HIGH (ref 70–99)

## 2018-05-26 LAB — BASIC METABOLIC PANEL
Anion gap: 10 (ref 5–15)
Anion gap: 11 (ref 5–15)
BUN: 13 mg/dL (ref 8–23)
BUN: 12 mg/dL (ref 8–23)
CO2: 20 mmol/L — ABNORMAL LOW (ref 22–32)
CO2: 24 mmol/L (ref 22–32)
CREATININE: 0.78 mg/dL (ref 0.44–1.00)
Calcium: 7.7 mg/dL — ABNORMAL LOW (ref 8.9–10.3)
Calcium: 8 mg/dL — ABNORMAL LOW (ref 8.9–10.3)
Chloride: 109 mmol/L (ref 98–111)
Chloride: 104 mmol/L (ref 98–111)
Creatinine, Ser: 0.99 mg/dL (ref 0.44–1.00)
GFR calc Af Amer: 60 mL/min — ABNORMAL LOW (ref >60)
GFR calc non Af Amer: >60 mL/min (ref >60)
GFR, EST NON AFRICAN AMERICAN: 52 mL/min — AB (ref >60)
Glucose, Bld: 138 mg/dL — ABNORMAL HIGH (ref 70–99)
Glucose, Bld: 145 mg/dL — ABNORMAL HIGH (ref 70–99)
Potassium: 2.9 mmol/L — ABNORMAL LOW (ref 3.5–5.1)
Potassium: 3.6 mmol/L (ref 3.5–5.1)
Sodium: 139 mmol/L (ref 135–145)
Sodium: 139 mmol/L (ref 135–145)

## 2018-05-26 LAB — CBC
HCT: 37.3 % (ref 36.0–46.0)
Hemoglobin: 11.9 g/dL — ABNORMAL LOW (ref 12.0–15.0)
MCH: 30.5 pg (ref 26.0–34.0)
MCHC: 31.9 g/dL (ref 30.0–36.0)
MCV: 95.6 fL (ref 80.0–100.0)
Platelets: 212 10*3/uL (ref 150–400)
RBC: 3.9 MIL/uL (ref 3.87–5.11)
RDW: 14.9 % (ref 11.5–15.5)
WBC: 12.4 10*3/uL — ABNORMAL HIGH (ref 4.0–10.5)
nRBC: 0 % (ref 0.0–0.2)

## 2018-05-26 LAB — BRAIN NATRIURETIC PEPTIDE: B Natriuretic Peptide: 139 pg/mL — ABNORMAL HIGH (ref 0.0–100.0)

## 2018-05-26 LAB — URINE CULTURE: Culture: >=100000 — AB

## 2018-05-26 MED ORDER — SODIUM BICARBONATE 8.4 % IV SOLN
INTRAVENOUS | Status: AC
  Administered 2018-05-26: 50 meq via INTRAVENOUS
  Filled 2018-05-26: qty 50

## 2018-05-26 MED ORDER — CHLORHEXIDINE GLUCONATE 0.12 % MT SOLN
15.0000 mL | Freq: Two times a day (BID) | OROMUCOSAL | Status: DC
  Administered 2018-05-26 – 2018-05-27 (×4): 15 mL via OROMUCOSAL
  Filled 2018-05-26 (×4): qty 15

## 2018-05-26 MED ORDER — LEVALBUTEROL HCL 1.25 MG/0.5ML IN NEBU
1.2500 mg | INHALATION_SOLUTION | Freq: Once | RESPIRATORY_TRACT | Status: AC
  Administered 2018-05-26: 1.25 mg via RESPIRATORY_TRACT
  Filled 2018-05-26: qty 0.5

## 2018-05-26 MED ORDER — FUROSEMIDE 10 MG/ML IJ SOLN
40.0000 mg | Freq: Once | INTRAMUSCULAR | Status: AC
  Administered 2018-05-26: 40 mg via INTRAVENOUS
  Filled 2018-05-26: qty 4

## 2018-05-26 MED ORDER — CEFAZOLIN SODIUM-DEXTROSE 1-4 GM/50ML-% IV SOLN
1.0000 g | Freq: Two times a day (BID) | INTRAVENOUS | Status: DC
  Administered 2018-05-26 – 2018-05-28 (×5): 1 g via INTRAVENOUS
  Filled 2018-05-26 (×7): qty 50

## 2018-05-26 MED ORDER — FUROSEMIDE 10 MG/ML IJ SOLN
20.0000 mg | Freq: Once | INTRAMUSCULAR | Status: AC
  Administered 2018-05-26: 20 mg via INTRAVENOUS

## 2018-05-26 MED ORDER — ORAL CARE MOUTH RINSE
15.0000 mL | Freq: Two times a day (BID) | OROMUCOSAL | Status: DC
  Administered 2018-05-26 – 2018-05-27 (×3): 15 mL via OROMUCOSAL

## 2018-05-26 MED ORDER — IPRATROPIUM-ALBUTEROL 0.5-2.5 (3) MG/3ML IN SOLN: 3.0000 mL | RESPIRATORY_TRACT | Status: DC | PRN

## 2018-05-26 MED ORDER — FUROSEMIDE 10 MG/ML IJ SOLN
INTRAMUSCULAR | Status: AC
  Administered 2018-05-26: 40 mg
  Filled 2018-05-26: qty 4

## 2018-05-26 MED ORDER — SODIUM BICARBONATE 8.4 % IV SOLN
50.0000 meq | Freq: Once | INTRAVENOUS | Status: AC
  Administered 2018-05-26: 50 meq via INTRAVENOUS

## 2018-05-26 MED ORDER — FUROSEMIDE 10 MG/ML IJ SOLN
INTRAMUSCULAR | Status: AC
  Administered 2018-05-26: 20 mg via INTRAVENOUS
  Filled 2018-05-26: qty 2

## 2018-05-26 MED ORDER — POTASSIUM CHLORIDE 10 MEQ/100ML IV SOLN
10.0000 meq | INTRAVENOUS | Status: AC
  Administered 2018-05-26 (×4): 10 meq via INTRAVENOUS
  Filled 2018-05-26 (×5): qty 100

## 2018-05-26 NOTE — Progress Notes (Signed)
Called respiratory for breathing treatment. Patient was having crackles throughout lung sounds. Respiratory informed me that it was believed the patient was in fluid overload. Dr. Curly Rim and came up to see patient. New orders was given and  patient was transferred to step down.

## 2018-05-26 NOTE — Progress Notes (Signed)
PHARMACY NOTE -  ANTIBIOTIC RENAL DOSE ADJUSTMENT   Request received for Pharmacy to assist with antibiotic renal dose adjustment.   Patient has been initiated on cefazolin 2g Iv every 8 hours.  SCr 0.99, estimated CrCl 33 ml/min  Current dosage is not appropriate and needs dosage adjustment due to CrCl 66ml/min.  Dose has been adjusted to Cefazolin 1g IV every 12 hours.   Pernell Dupre, PharmD, BCPS Clinical Pharmacist 05/26/2018 1:12 PM

## 2018-05-26 NOTE — Progress Notes (Addendum)
Received pt from 2A to room CCU room 20, pt is on Bipap now, RN reported pt was admitted 05/23/2018 with sepsis and yesterday night started having some restlessness and resp distress/SOB. Stated pt with crackles to Bilat Lungs, no relief with neb treatment and pt was placed on bipap and transfer to step down. Pt is tachy now, moaning and withdraws to painful stimulation, pupils are equal and reactive to light. Pt has gen edma worse to left hand. Multiple scattered ecchymotic areas to arms, skin break to coccyx area. Pink form in place. Pt had one loose stool on transfer to CCU.

## 2018-05-26 NOTE — Care Management Note (Signed)
Case Management Note  Patient Details  Name: Suzanne Contreras MRN: [id number] Date of Birth: 30-Jan-1930  Subjective/Objective: Patient has increased fio2 requirements and is on Bipap.  Palliative as been consulted. Per palliative note daughter reports that the patient is part of [facility].  [facility] provided all needed outpatient services including home health if needed.   RNCM will cont to follow. Doran Clay RN BSN  [phone]                    Action/Plan:   Expected Discharge Date:                  Expected Discharge Plan:  [place]  In-House Referral:     Discharge planning Services  CM Consult  Post Acute Care Choice:    Choice offered to:     DME Arranged:    DME Agency:     HH Arranged:    [place] Agency:     Status of Service:  In process, will continue to follow  If discussed at Long Length of Stay Meetings, dates discussed:    Additional Comments:  Shelbie Hutching, RN 05/26/2018, 2:15 PM

## 2018-05-26 NOTE — Progress Notes (Signed)
Daily Progress Note   Patient Name: Suzanne Contreras       Date: 05/26/2018 DOB: [birth year]  Age: 85 y.o. MRN#: [id number] Attending Physician: [name], MD Primary Care Physician: Raford Pitcher, MD Admit Date: 05/23/2018  Reason for Consultation/Follow-up: Establishing goals of care  Subjective: Echo being done, patient moves head to voice, on bipap  Length of Stay: 3  Current Medications: Scheduled Meds:  . chlorhexidine  15 mL Mouth Rinse BID  . docusate sodium  100 mg Oral BID  . enoxaparin (LOVENOX) injection  40 mg Subcutaneous Q24H  . insulin aspart  0-9 Units Subcutaneous TID WC  . mouth rinse  15 mL Mouth Rinse q12n4p  . oseltamivir  30 mg Oral BID    Continuous Infusions: .  ceFAZolin (ANCEF) IV 2 g (05/26/18 6578)  . dextrose 50 mL/hr at 05/26/18 1008    PRN Meds: acetaminophen **OR** acetaminophen, bisacodyl, haloperidol **OR** haloperidol lactate, LORazepam, ondansetron **OR** ondansetron (ZOFRAN) IV, traZODone  Physical Exam     Constitutional:      General: She is not in acute distress.    Interventions: Nasal cannula in place.  HENT:     Head: Normocephalic and atraumatic.  Cardiovascular:     Rate and Rhythm: Regular rhythm and rate. Pulmonary:     Effort: Pulmonary effort is normal.  Skin:    General: Skin is warm and dry.  Neurological:     Mental Status: She is lethargic and disoriented.  Psychiatric:        Cognition and Memory: Cognition is impaired. Memory is impaired.       Vital Signs: BP (!) 100/57   Pulse 78   Temp 98.3 F (36.8 C)   Resp (!) 24   Ht 5' (1.524 m)   Wt 60.1 kg   SpO2 99%   BMI 25.88 kg/m  SpO2: SpO2: 99 % O2 Device: O2 Device: Bi-PAP O2 Flow Rate: O2 Flow Rate (L/min): 2 L/min  Intake/output summary:    Intake/Output Summary (Last 24 hours) at 05/26/2018 1230 Last data filed at 05/26/2018 0507 Gross per 24 hour  Intake 1169.63 ml  Output 750 ml  Net 419.63 ml   LBM: Last BM Date: 05/26/18 Baseline Weight: Weight: 59.7 kg Most recent weight: Weight: 60.1 kg       Palliative Assessment/Data: PPS 20%    Flowsheet Rows     Most Recent Value  Intake Tab  Referral Department  Hospitalist  Unit at Time of Referral  Med/Surg Unit  Palliative Care Primary Diagnosis  Neurology  Date Notified  05/24/18  Palliative Care Type  New Palliative care  Reason for referral  Clarify Goals of Care  Date of Admission  05/24/18  Date first seen by Palliative Care  05/25/18  # of days Palliative referral response time  1 Day(s)  # of days IP prior to Palliative referral  0  Clinical Assessment  Palliative Performance Scale Score  20%  Psychosocial & Spiritual Assessment  Palliative Care Outcomes  Patient/Family meeting held?  No      Patient Active Problem List   Diagnosis Date Noted  . Influenza   . Goals of care, counseling/discussion   .  Palliative care by specialist   . Sepsis ([place]) 05/23/2018  . Fall 03/14/2017  . Altered mental status 12/13/2016  . Confusion 12/12/2016  . Acute delirium 12/10/2016  . UTI (urinary tract infection) 12/10/2016  . Vaginal lump 10/22/2016  . Hypothyroidism due to acquired atrophy of thyroid 02/20/2016  . Late onset Alzheimer's disease with behavioral disturbance ([place]) 11/21/2015    Palliative Care Assessment & Plan   HPI: 85 y.o. female  with past medical history of dementia, DM, and skin cancer admitted on 05/23/2018 with AMS and decreased PO intake. Found to have lactic acid of 3.6 and diagnosed with sepsis. Urine culture positive for E. Coli. Also found to be positive for influenza A. PMT consulted for [place].  Assessment: Follow up with patient's son and daughter.   Spoke with patient's son first. He was unable to tell me much history saying  "she lives with my sister". He lives in [place] and has not had much contact with them. He tells me "she's been doing fine as far as I know". He told me it was okay to speak with his sister regarding patient's care and they would be making decisions jointly. I provided him an update of patient's medical condition and he states desire to continue current care and is hopeful for improvement.   I called patient's daughter, [name], to continue goals of care discussion.   I introduced Palliative Medicine as specialized medical care for people living with serious illness. It focuses on providing relief from the symptoms and stress of a serious illness. The goal is to improve quality of life for both the patient and the family.  [name] does confirm that Jonni Sanger is the patient's legal guardian, but does not explain the situation further.   We discussed a brief life review of the patient. [name] describes the patient as "fiesty". 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   As far as functional and nutritional status, she tells me patient has been ambulatory. Able to complete ADLs with guidance from her daughter. She endorses a great appetite. She does tell me about the patient's dementia and sundowning. She shares of her cognitive decline over the past several years.   [name] tells me that her mother has [facility] services.  She goes to adult daycare 3 days a week.    We discussed her current illness and what it means in the larger context of her on-going co-morbidities.  Natural disease trajectory and expectations at EOL were discussed. We discussed patient's sepsis, flu, UTI, and now HF exacerbation with respiratory failure. [name] expresses understanding of illnesses and expresses hope for improvement. She would like to continue current care. She feels as though her mother is improving because she was more interactive with her this morning. Patient is a DNR.  Patient is under  [facility] and goals of care are typically managed well through [facility] services as well as transition to type of care patient and family need.  Planned with daughter to continue to update her throughout the week and approach decisions day by day depending on patient's clinical course - she remains hopeful for improvement and would like to continue current level of care. Brother and legal guardian agrees.   Questions and concerns were addressed. The family was encouraged to call with questions or concerns.   Recommendations/Plan:  Patient is DNR, family would like to continue current level of care - education provided about patient's illnesses   Patient is a  [facility] patient - goals of care typically managed well through [facility] services - goal is to return home with [facility]  PMT to continue to provide updates to patient's family and discuss goals of care as needed  Goals of Care and Additional Recommendations:  Limitations on Scope of Treatment: Full Scope Treatment  Code Status:  DNR  Prognosis:   Unable to determine  Discharge Planning:  To Be Determined  Care plan was discussed with patient's son and daughter  Thank you for allowing the Palliative Medicine Team to assist in the care of this patient.   Total Time 35 minutes Prolonged Time Billed  no       Greater than 50%  of this time was spent counseling and coordinating care related to the above assessment and plan.  Juel Burrow, DNP, [facility] Palliative Medicine Team Team Phone # [phone]  Pager [phone]

## 2018-05-26 NOTE — Progress Notes (Signed)
[facility] [place] NAME: Suzanne Contreras    MR#:  [id number]  DATE OF BIRTH:  1929-07-18  SUBJECTIVE:  Patient is demented per baseline, case discussed with the patient's son with all questions answered  REVIEW OF SYSTEMS:  CONSTITUTIONAL: No fever, fatigue or weakness.  EYES: No blurred or double vision.  EARS, NOSE, AND THROAT: No tinnitus or ear pain.  RESPIRATORY: No cough, shortness of breath, wheezing or hemoptysis.  CARDIOVASCULAR: No chest pain, orthopnea, edema.  GASTROINTESTINAL: No nausea, vomiting, diarrhea or abdominal pain.  GENITOURINARY: No dysuria, hematuria.  ENDOCRINE: No polyuria, nocturia,  HEMATOLOGY: No anemia, easy bruising or bleeding SKIN: No rash or lesion. MUSCULOSKELETAL: No joint pain or arthritis.   NEUROLOGIC: No tingling, numbness, weakness.  PSYCHIATRY: No anxiety or depression.   ROS  DRUG ALLERGIES:   Allergies  Allergen Reactions  . Penicillins     VITALS:  Blood pressure 124/74, pulse 100, temperature 98.3 F (36.8 C), resp. rate (!) 23, height 5' (1.524 m), weight 60.1 kg, SpO2 92 %.  PHYSICAL EXAMINATION:  GENERAL:  85 y.o.-year-old patient lying in the bed with no acute distress.  EYES: Pupils equal, round, reactive to light and accommodation. No scleral icterus. Extraocular muscles intact.  HEENT: Head atraumatic, normocephalic. Oropharynx and nasopharynx clear.  NECK:  Supple, no jugular venous distention. No thyroid enlargement, no tenderness.  LUNGS: Normal breath sounds bilaterally, no wheezing, rales,rhonchi or crepitation. No use of accessory muscles of respiration.  CARDIOVASCULAR: S1, S2 normal. No murmurs, rubs, or gallops.  ABDOMEN: Soft, nontender, nondistended. Bowel sounds present. No organomegaly or mass.  EXTREMITIES: No pedal edema, cyanosis, or clubbing.  NEUROLOGIC: Cranial nerves II through XII are intact. Muscle strength 5/5 in all extremities. Sensation intact. Gait not  checked.  PSYCHIATRIC: The patient is alert and oriented x 3.  SKIN: No obvious rash, lesion, or ulcer.   Physical Exam LABORATORY PANEL:   CBC Recent Labs  Lab 05/26/18 0249  WBC 12.4*  HGB 11.9*  HCT 37.3  PLT 212   ------------------------------------------------------------------------------------------------------------------  Chemistries  Recent Labs  Lab 05/23/18 1245  05/26/18 1014  NA 137   < > 139  K 3.6   < > 3.6  CL 105   < > 104  CO2 20*   < > 24  GLUCOSE 142*   < > 145*  BUN 22   < > 12  CREATININE 1.10*   < > 0.99  CALCIUM 8.5*   < > 8.0*  AST 28  --   --   ALT 11  --   --   ALKPHOS 72  --   --   BILITOT 0.6  --   --    < > = values in this interval not displayed.   ------------------------------------------------------------------------------------------------------------------  Cardiac Enzymes Recent Labs  Lab 05/23/18 1245  TROPONINI <0.03   ------------------------------------------------------------------------------------------------------------------  RADIOLOGY:  Dg Chest 1 View  Result Date: 05/26/2018 CLINICAL DATA:  Shortness of breath. EXAM: CHEST  1 VIEW COMPARISON:  Radiographs 3 days ago. FINDINGS: The heart is enlarged. Hilar prominence is unchanged. Development of bilateral pleural effusions with increased atelectasis from prior. Increased vascular congestion. No pneumothorax. No acute osseous abnormalities are seen. IMPRESSION: Findings consistent with CHF. Electronically Signed   By: Keith Rake M.D.   On: 05/26/2018 02:21    ASSESSMENT AND PLAN:  85 year old female with advanced Alzheimer's dementia comes from home because of worsening confusion now found to have sepsis  due to influenza type B, pneumonia, UTI.  *acute sepsis Resolving Secondary to influenza type A, E. coli UTI Continue sepsis protocol, urine cultures noted for E. coli, follow-up on outstanding cultures  *Acute E. coli UTI  Resolving  Change  antibiotics to Ancef given pansensitive E. coli on culture   *Acute influenza type a  Resolving  Continue supportive care, Tamiflu twice daily   *Lactic acidosis  Secondary to above  Resolved with IV fluids for rehydration   *Acute sinus tachycardia  secondary to sepsis as well as agitation from dementia Continue IV fluids for rehydration, antibiotics per above, supportive care for dementia    *Chronic severe advanced dementia with agitation Continue increased nursing care PRN, aspiration/fall/skin care precautions while in house, continue Risperdal, Depakote, palliative care consulted given long-term poor prognosis, Haldol as needed   *Recent skin cancer  Followed by [facility] dermatology-we will need follow-up status post discharge for continued care       All the records are reviewed and case discussed with Care Management/Social Workerr. Management plans discussed with the patient, family and they are in agreement.  CODE STATUS: dnr  TOTAL TIME TAKING CARE OF THIS PATIENT: 35 minutes.     POSSIBLE D/C IN 2-3 DAYS, DEPENDING ON CLINICAL CONDITION.   Avel Peace Suzanne Contreras M.D on 05/26/2018   Between 7am to 6pm - Pager - [phone]  After 6pm go to [email or web address] - password [email or web address] [place] [facility]  Office  [phone]  CC: Primary care physician; Suzanne Pitcher, MD  Note: This dictation was prepared with Dragon dictation along with smaller phrase technology. Any transcriptional errors that result from this process are unintentional.

## 2018-05-26 NOTE — Progress Notes (Addendum)
Suzanne Contreras NAME: Suzanne Contreras    MR#:  [id number]  DATE OF BIRTH:  [birth year]  SUBJECTIVE:  Patient is demented per baseline, patient transferred to stepdown unit for acute respiratory failure overnight requiring BiPAP  REVIEW OF SYSTEMS:  CONSTITUTIONAL: No fever, fatigue or weakness.  EYES: No blurred or double vision.  EARS, NOSE, AND THROAT: No tinnitus or ear pain.  RESPIRATORY: No cough, shortness of breath, wheezing or hemoptysis.  CARDIOVASCULAR: No chest pain, orthopnea, edema.  GASTROINTESTINAL: No nausea, vomiting, diarrhea or abdominal pain.  GENITOURINARY: No dysuria, hematuria.  ENDOCRINE: No polyuria, nocturia,  HEMATOLOGY: No anemia, easy bruising or bleeding SKIN: No rash or lesion. MUSCULOSKELETAL: No joint pain or arthritis.   NEUROLOGIC: No tingling, numbness, weakness.  PSYCHIATRY: No anxiety or depression.   ROS  DRUG ALLERGIES:   Allergies  Allergen Reactions  . Penicillins     VITALS:  Blood pressure 124/74, pulse 100, temperature 98.3 F (36.8 C), resp. rate (!) 23, height 5' (1.524 m), weight 60.1 kg, SpO2 92 %.  PHYSICAL EXAMINATION:  GENERAL:  85 y.o.-year-old patient lying in the bed with no acute distress.  EYES: Pupils equal, round, reactive to light and accommodation. No scleral icterus. Extraocular muscles intact.  HEENT: Head atraumatic, normocephalic. Oropharynx and nasopharynx clear.  NECK:  Supple, no jugular venous distention. No thyroid enlargement, no tenderness.  LUNGS: Normal breath sounds bilaterally, no wheezing, rales,rhonchi or crepitation. No use of accessory muscles of respiration.  CARDIOVASCULAR: S1, S2 normal. No murmurs, rubs, or gallops.  ABDOMEN: Soft, nontender, nondistended. Bowel sounds present. No organomegaly or mass.  EXTREMITIES: No pedal edema, cyanosis, or clubbing.  NEUROLOGIC: Cranial nerves II through XII are intact. Muscle strength 5/5 in all extremities.  Sensation intact. Gait not checked.  PSYCHIATRIC: The patient is alert and oriented x 3.  SKIN: No obvious rash, lesion, or ulcer.   Physical Exam LABORATORY PANEL:   CBC Recent Labs  Lab 05/26/18 0249  WBC 12.4*  HGB 11.9*  HCT 37.3  PLT 212   ------------------------------------------------------------------------------------------------------------------  Chemistries  Recent Labs  Lab 05/23/18 1245  05/26/18 1014  NA 137   < > 139  K 3.6   < > 3.6  CL 105   < > 104  CO2 20*   < > 24  GLUCOSE 142*   < > 145*  BUN 22   < > 12  CREATININE 1.10*   < > 0.99  CALCIUM 8.5*   < > 8.0*  AST 28  --   --   ALT 11  --   --   ALKPHOS 72  --   --   BILITOT 0.6  --   --    < > = values in this interval not displayed.   ------------------------------------------------------------------------------------------------------------------  Cardiac Enzymes Recent Labs  Lab 05/23/18 1245  TROPONINI <0.03   ------------------------------------------------------------------------------------------------------------------  RADIOLOGY:  Dg Chest 1 View  Result Date: 05/26/2018 CLINICAL DATA:  Shortness of breath. EXAM: CHEST  1 VIEW COMPARISON:  Radiographs 3 days ago. FINDINGS: The heart is enlarged. Hilar prominence is unchanged. Development of bilateral pleural effusions with increased atelectasis from prior. Increased vascular congestion. No pneumothorax. No acute osseous abnormalities are seen. IMPRESSION: Findings consistent with CHF. Electronically Signed   By: Keith Rake M.D.   On: 05/26/2018 02:21    ASSESSMENT AND PLAN:  85 year old female with advanced Alzheimer's dementia comes from home because of worsening confusion now found to  have sepsis due to influenza type B, pneumonia, UTI.  *acute sepsis Resolving Secondary to influenza type A, E. coli UTI Continue sepsis protocol, urine cultures noted for E. Coli  *Acute hypoxic respiratory failure Transfer to stepdown  unit on May 25, 2017 Secondary to ?acute congestive heart failure exacerbation versus worsening influenza infection Continue supplemental oxygen with weaning as tolerated, follow-up on echocardiogram, strict I&O monitoring, daily weights   *Acute E. coli UTI  Resolving  Change antibiotics to Ancef given pansensitive E. coli on culture   *Acute influenza type a  Resolving  Continue supportive care, Tamiflu twice daily   *Lactic acidosis  Secondary to above  Resolved with IV fluids for rehydration   *Acute sinus tachycardia  secondary to sepsis as well as agitation from dementia Continue IV fluids for rehydration, antibiotics per above, supportive care for dementia    *Chronic severe advanced dementia with agitation Continue increased nursing care PRN, aspiration/fall/skin care precautions while in house, continue Risperdal, Depakote, palliative care consulted given long-term poor prognosis, Haldol as needed   *Recent skin cancer  Followed by [facility] dermatology-we will need follow-up status post discharge for continued care   All the records are reviewed and case discussed with Care Management/Social Workerr. Management plans discussed with the patient, family and they are in agreement.  CODE STATUS: dnr  TOTAL TIME TAKING CARE OF THIS PATIENT: 35 minutes.   POSSIBLE D/C IN 2-3 DAYS, DEPENDING ON CLINICAL CONDITION.   Suzanne Contreras M.D on 05/26/2018   Between 7am to 6pm - Pager - [phone]  After 6pm go to [email or web address] - password [email or web address] [place] [facility]  Office  [phone]  CC: Primary care physician; Suzanne Pitcher, MD  Note: This dictation was prepared with Dragon dictation along with smaller phrase technology. Any transcriptional errors that result from this process are unintentional.

## 2018-05-26 NOTE — Progress Notes (Signed)
Called for svn for this patient. Patient with coarse crackles throughtout and unresponsive to commands. o2 saturation noted at 78% on 2 liters o2.  RN paged MD who arrived quickly to bedside. Placed patient on NRB and obtained abg per Dr Jannifer Franklin. Results called to Dr Jannifer Franklin. Received order for bipap. Placed patient on bipap with 100% o2. Patient now 95% after placing on bipap and receiving lasix.

## 2018-05-26 NOTE — Consult Note (Signed)
Name: Suzanne Contreras MRN: [id number] DOB: 08-25-1929    ADMISSION DATE:  05/23/2018 CONSULTATION DATE:  05/26/18  REFERRING MD :  Dr. Jannifer Franklin  CHIEF COMPLAINT:  Acute Respiratory Distress and Hypoxia  BRIEF PATIENT DESCRIPTION:  85 y.o. Female, DNR, admitted initially to med-surg unit on 05/23/18 for Sepsis secondary to UTI, Pneumonia, and Influenza.  On 05/26/18, pt with acute respiratory distress and hypoxia secondary to Acute Decompensated CHF, requiring transfer to [facility] for BiPAP.  SIGNIFICANT EVENTS  05/23/18>>Admission to [facility] Med-surg unit 05/26/18>> Acute Respiratory Distress requiring transfer to Stepdown for BiPAP  STUDIES:  Echocardiogram 05/26/18>>  CULTURES: Urine 05/23/18>> E.coli Blood x2 05/23/18>> MRSA PCR 05/25/18>> Negative Influenza PCR 05/23/18>> Positive for Influenza A  ANTIBIOTICS: Azithromycin 1/6>>1/8 Rocephin 1/6>>1/8 Cefazolin 1/8>> Tamiflu 1/5>>   HISTORY OF PRESENT ILLNESS:   Suzanne Contreras is a 85 y.o. Female, DNR, who presented to [facility] ED on 05/23/18 due to altered mental status and hypoxia (O2 sats 88%).  She was found to have UTI, Pneumonia, and Influenza A.  She was admitted to [place] unit for treatment of sepsis secondary to UTI, Pneumonia, and Influenza A.  On 05/26/18, she developed acute respiratory distress and worsening hypoxia.  CXR is concerning for CHF, and ABG with hypoxia and metabolic acidosis.  She was placed on BiPAP, received 40 mg IV Lasix, and transferred to [facility] unit. PCCM is consulted for further management of Acute Hypoxic Respiratory Failure secondary to Acute Decompensated Heart Failure.   PAST MEDICAL HISTORY :   has a past medical history of Dementia ([place]), Diabetes mellitus without complication ([place]), and Skin cancer.  has no past surgical history on file. Prior to Admission medications   Medication Sig Start Date End Date Taking? Authorizing Provider  Melatonin 5 MG TABS Take 15 mg by mouth at bedtime.   Yes [provider]  memantine (NAMENDA) 5 MG tablet Take 5 mg by mouth 2 (two) times daily.   Yes [provider]  divalproex (DEPAKOTE SPRINKLE) 125 MG capsule Take 2 capsules (250 mg total) by mouth 2 (two) times daily. Patient not taking: Reported on 05/24/2018 03/17/17   Max Sane, MD  OLANZapine (ZYPREXA) 2.5 MG tablet Take 1 tablet (2.5 mg total) by mouth 2 (two) times daily. Patient not taking: Reported on 05/24/2018 03/17/17   Max Sane, MD   Allergies  Allergen Reactions  . Penicillins     FAMILY HISTORY:  family history is not on file. SOCIAL HISTORY:  reports that she has never smoked. She has never used smokeless tobacco. She reports that she does not drink alcohol or use drugs.  REVIEW OF SYSTEMS:   Unable to obtain due to AMS and Lethargy  SUBJECTIVE:  Unable to obtain due to AMS and lethargy  VITAL SIGNS: Temp:  [97.7 F (36.5 C)-98.2 F (36.8 C)] 97.7 F (36.5 C) (01/09 0132) Pulse Rate:  [88-124] 121 (01/09 0244) Resp:  [18-39] 39 (01/09 0244) BP: (96-152)/(60-91) 121/60 (01/09 0244) SpO2:  [63 %-94 %] 87 % (01/09 0244)  PHYSICAL EXAMINATION: General:  Acutely ill appearing female, laying in bed, on BiPAP, in moderate respiratory distress Neuro:  Somnolent, withdraws from pain, moans to pain, Pupils PERRL 3 mm sluggish bilaterally HEENT:  Atraumatic, normocephalic, neck supple, no JVD Cardiovascular:  Tachycardia, Regular rhythm, s1s2, no M/R/G, 2+ pulses throughout Lungs:  Coarse rhonchi auscultated throughout, tachypnea,increased work of breathing and assessory muscle use, BiPAP asssisted Abdomen:  Soft, nontender, nondistended,no guarding or rebound tenderness, BS+ x4 Musculoskeletal:  No deformities, extremities well perfused Skin:  Warm/dry.  No obvious rashes, lesions, or ulcerations  Recent Labs  Lab 05/23/18 1245 05/24/18 0659 05/26/18 0249  NA 137 139 139  K 3.6 3.8 2.9*  CL 105 114* 109  CO2 20* 18* 20*  BUN 22 22 13   CREATININE 1.10* 0.84 0.78   GLUCOSE 142* 101* 138*   Recent Labs  Lab 05/24/18 0659 05/25/18 0500 05/26/18 0249  HGB 11.0* 10.5* 11.9*  HCT 35.0* 32.4* 37.3  WBC 16.0* 12.1* 12.4*  PLT 205 207 212   Dg Chest 1 View  Result Date: 05/26/2018 CLINICAL DATA:  Shortness of breath. EXAM: CHEST  1 VIEW COMPARISON:  Radiographs 3 days ago. FINDINGS: The heart is enlarged. Hilar prominence is unchanged. Development of bilateral pleural effusions with increased atelectasis from prior. Increased vascular congestion. No pneumothorax. No acute osseous abnormalities are seen. IMPRESSION: Findings consistent with CHF. Electronically Signed   By: Keith Rake M.D.   On: 05/26/2018 02:21    ASSESSMENT / PLAN:  Acute Hypoxic Respiratory Failure in setting of Acute Decompensated CHF, Pneumonia, and Influenza -Supplemental O2 to maintain O2 sats >92% -BiPAP, wean as tolerated (Pt is a DNR/DNI) -Follow intermittent CXR and ABG -Trend BNP -Received 40 mg IV Lasix x1 dose -Will give additional 20 mg IV Lasix x1 dose given current respiratory distress -Discontinue maintenance fluids -Obtain Echocardiogram  Hypokalemia Metabolic Acidosis -Monitor I&O's / urinary output -Follow BMP with diuresis -Ensure adequate renal perfusion -Avoid nephrotoxic agents as able -Replace electrolytes as indicated -Replete K -Will give 1 amp Bicarb -Follow up BMP @ 1000   Sepsis secondary to UTI (Pansensitive E.coli), Pneumonia, Influenza A>> stable -Monitor fever curve -Trend WBC's -Follow cultures as above -Continue Cefazolin and Tamiflu -IVF discontinued due to pulmonary edema   DISPOSITION: Stepdown GOALS OF CARE: DNR/DNI.  Palliative care consulted VTE PROPHYLAXIS: Lovenox UPDATES:  No family at bedside during NP rounds.  Called and left message with pt's son Suzanne Contreras.   Darel Hong, AGACNP-BC [facility] Pager: [phone] Cell: [phone]  05/26/2018, 3:17 AM

## 2018-05-26 NOTE — Progress Notes (Signed)
*  PRELIMINARY RESULTS* Echocardiogram 2D Echocardiogram has been performed.  Suzanne Contreras 05/26/2018, 11:27 AM

## 2018-05-27 ENCOUNTER — Inpatient Hospital Stay: Payer: Medicare (Managed Care)

## 2018-05-27 DIAGNOSIS — J9601 Acute respiratory failure with hypoxia: Secondary | ICD-10-CM

## 2018-05-27 DIAGNOSIS — L8990 Pressure ulcer of unspecified site, unspecified stage: Secondary | ICD-10-CM

## 2018-05-27 LAB — CBC
HEMATOCRIT: 34.6 % — AB (ref 36.0–46.0)
Hemoglobin: 11 g/dL — ABNORMAL LOW (ref 12.0–15.0)
MCH: 30.4 pg (ref 26.0–34.0)
MCHC: 31.8 g/dL (ref 30.0–36.0)
MCV: 95.6 fL (ref 80.0–100.0)
Platelets: 183 10*3/uL (ref 150–400)
RBC: 3.62 MIL/uL — ABNORMAL LOW (ref 3.87–5.11)
RDW: 14.4 % (ref 11.5–15.5)
WBC: 16.3 10*3/uL — ABNORMAL HIGH (ref 4.0–10.5)
nRBC: 0 % (ref 0.0–0.2)

## 2018-05-27 LAB — GLUCOSE, CAPILLARY
GLUCOSE-CAPILLARY: 80 mg/dL (ref 70–99)
Glucose-Capillary: 104 mg/dL — ABNORMAL HIGH (ref 70–99)
Glucose-Capillary: 77 mg/dL (ref 70–99)
Glucose-Capillary: 106 mg/dL — ABNORMAL HIGH (ref 70–99)

## 2018-05-27 LAB — ECHOCARDIOGRAM COMPLETE
Height: 60 in
Weight: 2120 oz

## 2018-05-27 LAB — BASIC METABOLIC PANEL
ANION GAP: 8 (ref 5–15)
BUN: 18 mg/dL (ref 8–23)
CO2: 28 mmol/L (ref 22–32)
Calcium: 7.9 mg/dL — ABNORMAL LOW (ref 8.9–10.3)
Chloride: 102 mmol/L (ref 98–111)
Creatinine, Ser: 0.84 mg/dL (ref 0.44–1.00)
GFR calc non Af Amer: >60 mL/min (ref >60)
Glucose, Bld: 141 mg/dL — ABNORMAL HIGH (ref 70–99)
Potassium: 3.4 mmol/L — ABNORMAL LOW (ref 3.5–5.1)
Sodium: 138 mmol/L (ref 135–145)

## 2018-05-27 LAB — BRAIN NATRIURETIC PEPTIDE: B Natriuretic Peptide: 138 pg/mL — ABNORMAL HIGH (ref 0.0–100.0)

## 2018-05-27 NOTE — Progress Notes (Signed)
Dana NP notified pf patients blood sugar of 77. Orders to increase d10 to 35 ml/hr.

## 2018-05-27 NOTE — Progress Notes (Addendum)
[place] at [place] NAME: Suzanne Contreras    MR#:  [id number]  DATE OF BIRTH:  02-14-1930  SUBJECTIVE:  Patient is demented per baseline, patient transferred to stepdown unit for acute respiratory failure overnight requiring BiPAP May 25, 2018, continues to require BiPAP  REVIEW OF SYSTEMS:  CONSTITUTIONAL: No fever, fatigue or weakness.  EYES: No blurred or double vision.  EARS, NOSE, AND THROAT: No tinnitus or ear pain.  RESPIRATORY: No cough, shortness of breath, wheezing or hemoptysis.  CARDIOVASCULAR: No chest pain, orthopnea, edema.  GASTROINTESTINAL: No nausea, vomiting, diarrhea or abdominal pain.  GENITOURINARY: No dysuria, hematuria.  ENDOCRINE: No polyuria, nocturia,  HEMATOLOGY: No anemia, easy bruising or bleeding SKIN: No rash or lesion. MUSCULOSKELETAL: No joint pain or arthritis.   NEUROLOGIC: No tingling, numbness, weakness.  PSYCHIATRY: No anxiety or depression.   ROS  DRUG ALLERGIES:   Allergies  Allergen Reactions  . Penicillins     VITALS:  Blood pressure 121/64, pulse (!) 101, temperature 99.2 F (37.3 C), resp. rate 19, height 5' (1.524 m), weight 58.3 kg, SpO2 93 %.  PHYSICAL EXAMINATION:  GENERAL:  85 y.o.-year-old patient lying in the bed with no acute distress.  EYES: Pupils equal, round, reactive to light and accommodation. No scleral icterus. Extraocular muscles intact.  HEENT: Head atraumatic, normocephalic. Oropharynx and nasopharynx clear.  NECK:  Supple, no jugular venous distention. No thyroid enlargement, no tenderness.  LUNGS: Normal breath sounds bilaterally, no wheezing, rales,rhonchi or crepitation. No use of accessory muscles of respiration.  CARDIOVASCULAR: S1, S2 normal. No murmurs, rubs, or gallops.  ABDOMEN: Soft, nontender, nondistended. Bowel sounds present. No organomegaly or mass.  EXTREMITIES: No pedal edema, cyanosis, or clubbing.  NEUROLOGIC: Cranial nerves II through XII are  intact. Muscle strength 5/5 in all extremities. Sensation intact. Gait not checked.  PSYCHIATRIC: The patient is alert and oriented x 3.  SKIN: No obvious rash, lesion, or ulcer.   Physical Exam LABORATORY PANEL:   CBC Recent Labs  Lab 05/27/18 0329  WBC 16.3*  HGB 11.0*  HCT 34.6*  PLT 183   ------------------------------------------------------------------------------------------------------------------  Chemistries  Recent Labs  Lab 05/23/18 1245  05/27/18 0329  NA 137   < > 138  K 3.6   < > 3.4*  CL 105   < > 102  CO2 20*   < > 28  GLUCOSE 142*   < > 141*  BUN 22   < > 18  CREATININE 1.10*   < > 0.84  CALCIUM 8.5*   < > 7.9*  AST 28  --   --   ALT 11  --   --   ALKPHOS 72  --   --   BILITOT 0.6  --   --    < > = values in this interval not displayed.   ------------------------------------------------------------------------------------------------------------------  Cardiac Enzymes Recent Labs  Lab 05/23/18 1245  TROPONINI <0.03   ------------------------------------------------------------------------------------------------------------------  RADIOLOGY:  Dg Chest 1 View  Result Date: 05/26/2018 CLINICAL DATA:  Shortness of breath. EXAM: CHEST  1 VIEW COMPARISON:  Radiographs 3 days ago. FINDINGS: The heart is enlarged. Hilar prominence is unchanged. Development of bilateral pleural effusions with increased atelectasis from prior. Increased vascular congestion. No pneumothorax. No acute osseous abnormalities are seen. IMPRESSION: Findings consistent with CHF. Electronically Signed   By: Keith Rake M.D.   On: 05/26/2018 02:21   Dg Chest Port 1 View  Result Date: 05/27/2018 CLINICAL DATA:  Acute respiratory  failure. EXAM: PORTABLE CHEST 1 VIEW COMPARISON:  05/26/2018. 05/23/2018. 03/14/2017. 02/07/2017. 12/10/2016. FINDINGS: Mediastinum is normal. Heart size stable. Right perihilar and right base atelectasis and infiltrates. Underlying mass lesions can  not be excluded. Close follow-up chest x-rays to demonstrate clearing suggested. Mild subsegmental atelectasis left lung base. Chronic interstitial prominence noted bilaterally. Small right pleural effusion. No pneumothorax. IMPRESSION: 1. Right perihilar and right base atelectasis and infiltrates. Underlying mass lesions can not be excluded. Small right pleural effusion. Close follow-up chest x-rays recommended to demonstrate clearing. Followup PA and lateral chest X-ray is recommended in 3-4 weeks following trial of antibiotic therapy to ensure resolution and exclude underlying malignancy. 2. Mild left base subsegmental atelectasis. Chronic interstitial disease. Electronically Signed   By: Marcello Moores  Register   On: 05/27/2018 07:02    ASSESSMENT AND PLAN:  85 year old female with advanced Alzheimer's dementia comes from home because of worsening confusion now found to have sepsis due to influenza type B, pneumonia, UTI.  *acute sepsis Resolving Secondary to influenza type A, E. coli UTI, and CAP Continue sepsis protocol, urine cultures noted for E. Coli  *Acute hypoxic respiratory failure Transfer to stepdown unit on May 25, 2017 Secondary to worsening influenza infection and CAP Continue BiPAP/supplemental oxygen with weaning as tolerated, echocardiogram noted for ejection fraction 45%/YKDXI I diastolic dysfunction   *Acute E. coli UTI  Resolving  Continue IV Ancef given pansensitive E. coli on culture   *Acute influenza type a  Resolving  Continue supportive care, Tamiflu twice daily, BiPAP/supplemental oxygen with weaning as tolerated   *Lactic acidosis  Secondary to above  Resolved with IV fluids for rehydration   *Acute sinus tachycardia  secondary to sepsis as well as agitation from dementia Continue IV fluids for rehydration, antibiotics per above, supportive care for dementia    *Chronic severe advanced dementia with agitation Continue increased nursing care PRN,  aspiration/fall/skin care precautions while in house, continue Risperdal, Depakote, palliative care consulted given long-term poor prognosis, Haldol as needed   *Recent skin cancer  Followed by [facility] dermatology-we will need follow-up status post discharge for continued care   All the records are reviewed and case discussed with Care Management/Social Workerr. Management plans discussed with the patient, family and they are in agreement.  CODE STATUS: dnr  TOTAL TIME TAKING CARE OF THIS PATIENT: 35 minutes.   POSSIBLE D/C IN 2-5 DAYS, DEPENDING ON CLINICAL CONDITION.   Avel Peace Anaiya Wisinski M.D on 05/27/2018   Between 7am to 6pm - Pager - [phone]  After 6pm go to [email or web address] - password [email or web address] [facility]  Office  [phone]  CC: Primary care physician; Raford Pitcher, MD  Note: This dictation was prepared with Dragon dictation along with smaller phrase technology. Any transcriptional errors that result from this process are unintentional.

## 2018-05-27 NOTE — Evaluation (Signed)
Clinical/Bedside Swallow Evaluation Patient Details  Name: Suzanne Contreras MRN: [id number] Date of Birth: [birth year]  Today's Date: 05/27/2018 Time: SLP Start Time (ACUTE ONLY): 1430 SLP Stop Time (ACUTE ONLY): 1530 SLP Time Calculation (min) (ACUTE ONLY): 60 min  Past Medical History:  Past Medical History:  Diagnosis Date  . Dementia ([place])   . Diabetes mellitus without complication ([place])   . Skin cancer    Past Surgical History: History reviewed. No pertinent surgical history. HPI:  Pt is an 85 y.o. female with past medical history of advanced Dementia, DM, and skin cancer admitted on 05/23/2018 with AMS and decreased PO intake. Found to have lactic acid of 3.6 and diagnosed with sepsis. Urine culture positive for E. Coli. Also found to be positive for influenza A. Per MD note, patient was transferred to stepdown unit for acute respiratory failure overnight post an aspiration event(?) requiring BiPAP May 25, 2018.   Assessment / Plan / Recommendation Clinical Impression  Pt appears to Moderate oropharyngeal phase dysphagia suspect most impacted by her advanced Cognitive decline from Dementia. Pt's Cognitive decline appeared to hamper pt's ability to comprehend the idea of po trials/tasks and benefit of po trials. During the oral phase, she exhibited a closed-mouth posture to presentation of spoon at lips(w/ Nectar consistency liquids). Pt required max verbal/tactile/visual cues to encourage accepting bolus trials; this was not very successful. Min bolus material was consumed while leakage occurred. A pharyngeal swallow was appreciated w/ no overt coughing or other s/s of aspiration following. O2 sats remained 98%, RR unlabored. Vocal quality was fairly clear; not declined from her baseline. No further trials were given as it felt po trials were being "forced" on pt. NSG agreeed. Pt tended to talk w/ bolus material in mouth. Pt was easily distracted and agitated by the po trial attempts w/ her.  When asked if she wanted anything to eat/drink, she stated "no". MD was present; the situation was discussed w/ her. MD agreed. w/ trial of a modiified diet consistency w/ strict aspiration precautions IF pt wanted to be given any po's. OM exam revealed no gross unilateral weakness; pt has missing native dentition. Recommend a full liquid diet w/ NECTAR consistency liquids at this time to reduce risk for aspiration from oropharyngeal phase dysphagia in light of pt's illness; advanced Cognitive decline/Dementia. Recommend aspiration precautions; feeding support by NSG and assessment of pt's desire for po's at each meal time. Recommend medications via IV at this time. ST services will continue to monitor pt's progress while admitted; consultation w/ NSG re: POC. Palliative Care following for [place] w/ family. Dietician f/u recommended. SLP Visit Diagnosis: Dysphagia, oropharyngeal phase (R13.12)    Aspiration Risk  Moderate aspiration risk;Risk for inadequate nutrition/hydration    Diet Recommendation  Full liquid diet - NECTAR consistency liquids by TSP w/ strict aspiration precautions; feeding support IF pt desires po intake. Reduce distractions at meals.   Medication Administration: Crushed with puree(given in Nectar liquid may be beneficial)    Other  Recommendations Recommended Consults: (Dietician f/u; Palliative Care f/u) Oral Care Recommendations: Oral care BID;Staff/trained caregiver to provide oral care Other Recommendations: Order thickener from pharmacy;Prohibited food (jello, ice cream, thin soups);Remove water pitcher;Have oral suction available   Follow up Recommendations (TBD)      Frequency and Duration min 3x week  2 weeks       Prognosis Prognosis for Safe Diet Advancement: Guarded Barriers to Reach Goals: Cognitive deficits;Time post onset;Severity of deficits      Swallow  Study   General Date of Onset: 05/23/18 HPI: Pt is an 85 y.o. female with past medical history of  advanced Dementia, DM, and skin cancer admitted on 05/23/2018 with AMS and decreased PO intake. Found to have lactic acid of 3.6 and diagnosed with sepsis. Urine culture positive for E. Coli. Also found to be positive for influenza A. Per MD note, patient was transferred to stepdown unit for acute respiratory failure overnight post an aspiration event(?) requiring BiPAP May 25, 2018. Type of Study: Bedside Swallow Evaluation Previous Swallow Assessment: none reported Diet Prior to this Study: Regular;Thin liquids(per MD orders) Temperature Spikes Noted: No(wbc 16.3) Respiratory Status: Nasal cannula(10 liters) History of Recent Intubation: No Behavior/Cognition: Confused;Agitated;Distractible;Requires cueing;Doesn't follow directions Oral Cavity Assessment: (unable to assess d/t Cognition) Oral Care Completed by SLP: Yes(but limited d/t pt's participation) Oral Cavity - Dentition: Missing dentition(native dentition) Vision: (n/a) Self-Feeding Abilities: Total assist Patient Positioning: Upright in bed(needed positioning; agitated by this) Baseline Vocal Quality: Low vocal intensity(mumbled speech) Volitional Cough: Cognitively unable to elicit Volitional Swallow: Unable to elicit    Oral/Motor/Sensory Function Overall Oral Motor/Sensory Function: (no gross, overt unilateral weakness noted)   Ice Chips Ice chips: Not tested   Thin Liquid Thin Liquid: Not tested    Nectar Thick Nectar Thick Liquid: Impaired Presentation: Spoon(3 trials) Oral Phase Impairments: Poor awareness of bolus(refused to open mouth to accept boluses) Oral phase functional implications: (spillage occurred) Pharyngeal Phase Impairments: (none) Other Comments: pt required max cues to encourage accepting bolus trials; this was not very successful. No further was given as it felt po trials were being "forced" on pt. NSG agreeed.    Honey Thick Honey Thick Liquid: Not tested   Puree Puree: Not tested   Solid      Solid: Not tested      Orinda Kenner, MS, CCC-SLP Watson,Katherine 05/27/2018,5:04 PM

## 2018-05-27 NOTE — Progress Notes (Signed)
Daily Progress Note   Patient Name: Suzanne Contreras       Date: 05/27/2018 DOB: 02/22/1930  Age: 85 y.o. MRN#: [id number] Attending Physician: [name], MD Primary Care Physician: Raford Pitcher, MD Admit Date: 05/23/2018  Reason for Consultation/Follow-up: Establishing goals of care  Subjective: On HFNC, does not speak to me - shakes her head to physical stimulation. No family at bedside.   Length of Stay: 4  Current Medications: Scheduled Meds:  . chlorhexidine  15 mL Mouth Rinse BID  . docusate sodium  100 mg Oral BID  . enoxaparin (LOVENOX) injection  40 mg Subcutaneous Q24H  . insulin aspart  0-9 Units Subcutaneous TID WC  . mouth rinse  15 mL Mouth Rinse q12n4p  . oseltamivir  30 mg Oral BID    Continuous Infusions: .  ceFAZolin (ANCEF) IV 1 g (05/27/18 0922)  . dextrose 25 mL/hr at 05/26/18 2311    PRN Meds: acetaminophen **OR** acetaminophen, bisacodyl, haloperidol **OR** haloperidol lactate, LORazepam, ondansetron **OR** ondansetron (ZOFRAN) IV, traZODone  Physical Exam         Constitutional:  General: She is not in acute distress. Interventions: Nasal cannulain place.  HENT:  Head: Normocephalicand atraumatic.  Cardiovascular:  Rate and Rhythm: Regular rhythm and rate. Pulmonary:  Effort: Pulmonary effort is normal.  Skin: General: Skin is warmand dry.  Neurological:  Mental Status: She is lethargicand disoriented.  Psychiatric:  Cognition and Memory: Cognition is impaired. Memory isimpaired.      Vital Signs: BP (!) 110/54   Pulse 99   Temp 98.7 F (37.1 C)   Resp (!) 23   Ht 5' (1.524 m)   Wt 58.3 kg   SpO2 96%   BMI 25.10 kg/m  SpO2: SpO2: 96 % O2 Device: O2 Device: High Flow Nasal Cannula O2 Flow Rate: O2 Flow  Rate (L/min): 10 L/min  Intake/output summary:   Intake/Output Summary (Last 24 hours) at 05/27/2018 1149 Last data filed at 05/27/2018 0600 Gross per 24 hour  Intake 677.58 ml  Output 1050 ml  Net -372.42 ml   LBM: Last BM Date: 05/26/18 Baseline Weight: Weight: 59.7 kg Most recent weight: Weight: 58.3 kg       Palliative Assessment/Data: PPS 10%    Flowsheet Rows     Most Recent Value  Intake Tab  Referral Department  Hospitalist  Unit at Time of Referral  Med/Surg Unit  Palliative Care Primary Diagnosis  Neurology  Date Notified  05/24/18  Palliative Care Type  New Palliative care  Reason for referral  Clarify Goals of Care  Date of Admission  05/24/18  Date first seen by Palliative Care  05/25/18  # of days Palliative referral response time  1 Day(s)  # of days IP prior to Palliative referral  0  Clinical Assessment  Palliative Performance Scale Score  20%  Psychosocial & Spiritual Assessment  Palliative Care Outcomes  Patient/Family meeting held?  Yes  Who was at the meeting?  daughter and son  Palliative Care Outcomes  Clarified goals of care, Provided psychosocial or spiritual support      Patient Active Problem List   Diagnosis Date Noted  . Pressure injury of skin  05/27/2018  . Influenza   . Goals of care, counseling/discussion   . Palliative care by specialist   . Alzheimer's dementia with behavioral disturbance ([place])   . Sepsis ([place]) 05/23/2018  . Fall 03/14/2017  . Altered mental status 12/13/2016  . Confusion 12/12/2016  . Acute delirium 12/10/2016  . UTI (urinary tract infection) 12/10/2016  . Vaginal lump 10/22/2016  . Hypothyroidism due to acquired atrophy of thyroid 02/20/2016  . Late onset Alzheimer's disease with behavioral disturbance ([place]) 11/21/2015    Palliative Care Assessment & Plan   HPI: 85 y.o.femalewith past medical history of dementia, DM, and skin canceradmitted on 1/6/2020with AMS and decreased PO intake.Found to have  lactic acid of 3.6 and diagnosed with sepsis. Urine culture positive for E. Coli. Also found to be positive for influenza A. PMT consulted for [place].  Assessment: Follow up today with patient's daughter. (Patient's son is legal guardian; however, he lives in [place]. He has given me permission to speak with patient's daughter regarding goals of care - patient lives with her daughter).  [name] continues to make statements about how "she's a fighter" and "has a strong heart".  We discussed patient's continued need for high amount of oxygen - needing bipap overnight. We discussed concerns about no PO intake and altered mental status. We discussed patient's sepsis, flu, UTI, and now HF exacerbation with respiratory failure. [name] expresses understanding of illnesses and expresses hope for improvement. She would like to continue current care.  I attempted to elicit values and goals of care important to the patient.  [name] tells me "Mama wouldn't want to give up". I shared my concerns that we may be at a place where the care we are providing is not benefiting Ms. Hammac and may be prolonging suffering.   The difference between aggressive medical intervention and comfort care was considered in light of the patient's goals of care. For now, [name] would like to continue current care - maintain DNR status. We discussed seeing how patient does over the weekend and discussing [place] again on Monday.   I shared that if patient has not shown significant improvement that it may be best to transition our focus to her comfort instead of aggressive medical care. [name] expresses understanding. She does share that her brother will have to be involved in this conversation as she does not want to make these decisions independently.   Patient is under PACE and goals of care are typically managed well through PACE services as well as transition to type of care patient and family need.  Questions and concerns were  addressed. The family was encouraged to call with questions or concerns.   Recommendations/Plan:  Patient is DNR, family would like to continue current level of care - education provided about patient's illnesses  Expressed concern about patient's increased oxygen needs - family would like to continue current care over the weekend - we discussed that a transition to comfort care may be most appropriate if patient shows no significant signs of improvement   Patient is a PACE patient - goals of care typically managed well through PACE services - family's goal is to return home with PACE  Goals of Care and Additional Recommendations:  Limitations on Scope of Treatment: Full Scope Treatment  Code Status:  DNR  Prognosis:   Unable to determine  Discharge Planning:  To Be Determined  Care plan was discussed with Dr. Patsey Berthold, RN, patient's daughter [name]  Thank you for allowing the Palliative Medicine Team to  assist in the care of this patient.   Total Time 35 minutes Prolonged Time Billed  no       Greater than 50%  of this time was spent counseling and coordinating care related to the above assessment and plan.  Juel Burrow, DNP, [facility] Palliative Medicine Team Team Phone # [phone]  Pager [phone]

## 2018-05-27 NOTE — Progress Notes (Addendum)
Patient ID: Suzanne Contreras, female   DOB: 1929-07-12, 85 y.o.   MRN: [id number] Patient has been able to be weaned off of the BiPAP.  She does not communicate well or interact well due to underlying severe dementia.  She does not appear to have appears to tolerate pured foods however refuses to eat when presented with food.  She does swallow pures without apparent aspiration.  She is basically contracted, with muscle wasting and occasionally moaning in bed.  Echocardiogram shows diastolic dysfunction and severe pulmonary hypertension which is a poor prognostic sign.  Will need to aim to keep treatment for this is only symptomatic.  Continue to treat urinary tract infection, pneumonia and influenza A.  Multidisciplinary rounds were performed with ICU team.  No family available to discuss case today.  DNR/DNI status is appropriate.  Recommend palliative approach.  Renold Don, MD [facility]

## 2018-05-28 ENCOUNTER — Encounter: Payer: Self-pay | Admitting: Internal Medicine

## 2018-05-28 DIAGNOSIS — J9601 Acute respiratory failure with hypoxia: Secondary | ICD-10-CM

## 2018-05-28 LAB — COMPREHENSIVE METABOLIC PANEL
ALT: 10 U/L (ref 0–44)
AST: 23 U/L (ref 15–41)
Albumin: 2.6 g/dL — ABNORMAL LOW (ref 3.5–5.0)
Alkaline Phosphatase: 58 U/L (ref 38–126)
Anion gap: 10 (ref 5–15)
BILIRUBIN TOTAL: 0.6 mg/dL (ref 0.3–1.2)
BUN: 18 mg/dL (ref 8–23)
CALCIUM: 8.1 mg/dL — AB (ref 8.9–10.3)
CO2: 29 mmol/L (ref 22–32)
CREATININE: 0.64 mg/dL (ref 0.44–1.00)
Chloride: 101 mmol/L (ref 98–111)
GFR calc Af Amer: >60 mL/min (ref >60)
GFR calc non Af Amer: >60 mL/min (ref >60)
Glucose, Bld: 116 mg/dL — ABNORMAL HIGH (ref 70–99)
Potassium: 3.6 mmol/L (ref 3.5–5.1)
Sodium: 140 mmol/L (ref 135–145)
Total Protein: 5.6 g/dL — ABNORMAL LOW (ref 6.5–8.1)

## 2018-05-28 LAB — GLUCOSE, CAPILLARY
Glucose-Capillary: 88 mg/dL (ref 70–99)
Glucose-Capillary: 85 mg/dL (ref 70–99)
Glucose-Capillary: 109 mg/dL — ABNORMAL HIGH (ref 70–99)
Glucose-Capillary: 110 mg/dL — ABNORMAL HIGH (ref 70–99)

## 2018-05-28 LAB — CBC
HEMATOCRIT: 32.4 % — AB (ref 36.0–46.0)
Hemoglobin: 10.4 g/dL — ABNORMAL LOW (ref 12.0–15.0)
MCH: 30.7 pg (ref 26.0–34.0)
MCHC: 32.1 g/dL (ref 30.0–36.0)
MCV: 95.6 fL (ref 80.0–100.0)
Platelets: 214 10*3/uL (ref 150–400)
RBC: 3.39 MIL/uL — ABNORMAL LOW (ref 3.87–5.11)
RDW: 14.3 % (ref 11.5–15.5)
WBC: 8.5 10*3/uL (ref 4.0–10.5)
nRBC: 0 % (ref 0.0–0.2)

## 2018-05-28 LAB — PHOSPHORUS: Phosphorus: 2.3 mg/dL — ABNORMAL LOW (ref 2.5–4.6)

## 2018-05-28 LAB — MAGNESIUM: Magnesium: 1.7 mg/dL (ref 1.7–2.4)

## 2018-05-28 LAB — CULTURE, BLOOD (ROUTINE X 2)
CULTURE: NO GROWTH
Culture: NO GROWTH

## 2018-05-28 MED ORDER — METOPROLOL TARTRATE 5 MG/5ML IV SOLN
5.0000 mg | Freq: Once | INTRAVENOUS | Status: AC
  Administered 2018-05-29: 5 mg via INTRAVENOUS
  Filled 2018-05-28: qty 5

## 2018-05-28 MED ORDER — GLYCOPYRROLATE 0.2 MG/ML IJ SOLN
0.2000 mg | INTRAMUSCULAR | Status: DC
  Administered 2018-05-29 (×2): 0.2 mg via INTRAVENOUS
  Filled 2018-05-28 (×4): qty 1

## 2018-05-28 MED ORDER — SODIUM CHLORIDE 0.9 % IV SOLN
INTRAVENOUS | Status: DC | PRN
  Administered 2018-05-28: 23:00:00 500 mL via INTRAVENOUS

## 2018-05-28 NOTE — Progress Notes (Addendum)
SLP Cancellation Note  Patient Details Name: Suzanne Contreras MRN: [id number] DOB: Sep 05, 1929   Cancelled treatment:       Reason Eval/Treat Not Completed: Fatigue/lethargy limiting ability to participate(chart reviewed; consulted NSG. ). NSG reported pt has not awakened much this morning and made no indication she wishes oral intake.  Continue to recommend a more liquid based diet of Nectar consistency for the consistency and to given po's ONLY if pt desires them; general aspiration precautions.  ST services will f/u next week. NSG agreed. MD updated.     Orinda Kenner, [place], CCC-SLP Watson,Katherine 05/28/2018, 10:27 AM

## 2018-05-28 NOTE — Progress Notes (Signed)
Pt switched to HFNC due to desats on [place] at 6 lpm

## 2018-05-28 NOTE — Progress Notes (Signed)
Patient weaned from 8l HFNC to 5l liter regular nasal cannula. Saturations ranging from 89-90% with sat goal of 88% or better.

## 2018-05-28 NOTE — Progress Notes (Signed)
[place] at [place] NAME: Suzanne Contreras    MR#:  [id number]  DATE OF BIRTH:  07-20-1929  SUBJECTIVE:   Drowsy.  Seems comfortable.  Responds in 1-2 words but does not open her eyes.  Confused.  REVIEW OF SYSTEMS:  Able to obtain due to mental status  ROS  DRUG ALLERGIES:   Allergies  Allergen Reactions  . Penicillins     VITALS:  Blood pressure 128/65, pulse 85, temperature 98.6 F (37 C), temperature source Oral, resp. rate (!) 22, height 5' (1.524 m), weight 58.3 kg, SpO2 96 %.  PHYSICAL EXAMINATION:  GENERAL:  85 y.o.-year-old patient lying in the bed with no acute distress.  EYES: Pupils equal, round, reactive to light and accommodation. No scleral icterus. Extraocular muscles intact.  HEENT: Head atraumatic, normocephalic. Oropharynx and nasopharynx clear.  NECK:  Supple, no jugular venous distention. No thyroid enlargement, no tenderness.  LUNGS: Normal breath sounds bilaterally, no wheezing, rales,rhonchi or crepitation. No use of accessory muscles of respiration.  CARDIOVASCULAR: S1, S2 normal. No murmurs, rubs, or gallops.  ABDOMEN: Soft, nontender, nondistended. Bowel sounds present. No organomegaly or mass.  EXTREMITIES: No pedal edema, cyanosis, or clubbing.  NEUROLOGIC: Not following instructions.  Moves all 4 extremities. PSYCHIATRIC: The patient is drowsy SKIN: No obvious rash, lesion, or ulcer.   Physical Exam LABORATORY PANEL:   CBC Recent Labs  Lab 05/28/18 0531  WBC 8.5  HGB 10.4*  HCT 32.4*  PLT 214   ------------------------------------------------------------------------------------------------------------------  Chemistries  Recent Labs  Lab 05/28/18 0531  NA 140  K 3.6  CL 101  CO2 29  GLUCOSE 116*  BUN 18  CREATININE 0.64  CALCIUM 8.1*  MG 1.7  AST 23  ALT 10  ALKPHOS 58  BILITOT 0.6    ------------------------------------------------------------------------------------------------------------------  Cardiac Enzymes Recent Labs  Lab 05/23/18 1245  TROPONINI <0.03   ------------------------------------------------------------------------------------------------------------------  RADIOLOGY:  Dg Chest Port 1 View  Result Date: 05/27/2018 CLINICAL DATA:  Acute respiratory failure. EXAM: PORTABLE CHEST 1 VIEW COMPARISON:  05/26/2018. 05/23/2018. 03/14/2017. 02/07/2017. 12/10/2016. FINDINGS: Mediastinum is normal. Heart size stable. Right perihilar and right base atelectasis and infiltrates. Underlying mass lesions can not be excluded. Close follow-up chest x-rays to demonstrate clearing suggested. Mild subsegmental atelectasis left lung base. Chronic interstitial prominence noted bilaterally. Small right pleural effusion. No pneumothorax. IMPRESSION: 1. Right perihilar and right base atelectasis and infiltrates. Underlying mass lesions can not be excluded. Small right pleural effusion. Close follow-up chest x-rays recommended to demonstrate clearing. Followup PA and lateral chest X-ray is recommended in 3-4 weeks following trial of antibiotic therapy to ensure resolution and exclude underlying malignancy. 2. Mild left base subsegmental atelectasis. Chronic interstitial disease. Electronically Signed   By: Marcello Moores  Register   On: 05/27/2018 07:02    ASSESSMENT AND PLAN:  85 year old female with advanced Alzheimer's dementia comes from home because of worsening confusion now found to have sepsis due to influenza type B, pneumonia, UTI.  *Severe sepsis Improving Secondary to influenza type A, E. coli UTI, and CAP Continue sepsis protocol, urine cultures noted for E. Coli  * ute hypoxic respiratory failure Transfered to stepdown unit on May 25, 2017 Secondary to worsening influenza infection and CAP Continue BiPAP/supplemental oxygen with weaning as  tolerated Echocardiogram noted for ejection fraction 02%/ stage I diastolic dysfunction   * E. coli UTI  Continue IV Ancef given pansensitive E. coli on culture   * Influenza type A  Tamiflu twice daily, BiPAP/supplemental  oxygen with weaning as tolerated   * severe advanced dementia with agitation Continue increased nursing care PRN, aspiration/fall/skin care precautions while in house, continue Risperdal, Depakote, palliative care consulted given long-term poor prognosis, Haldol as needed .  Appreciate input  * Recent skin cancer  Followed by [facility] dermatology-we will need follow-up status post discharge for continued care   Discussed care with Dr. Patsey Berthold in the ICU  All the records are reviewed and case discussed with Care Management/Social Workerr. Management plans discussed with the patient, family and they are in agreement.  CODE STATUS: DNR  TOTAL TIME TAKING CARE OF THIS PATIENT: 35 minutes.   POSSIBLE D/C IN 2-3 DAYS, DEPENDING ON CLINICAL CONDITION.   Neita Carp M.D on 05/28/2018   Between 7am to 6pm - Pager - [phone]  After 6pm go to [email or web address] - password [email or web address] [place]  Office  [phone]  CC: Primary care physician; Raford Pitcher, MD  Note: This dictation was prepared with Dragon dictation along with smaller phrase technology. Any transcriptional errors that result from this process are unintentional.

## 2018-05-28 NOTE — Progress Notes (Signed)
Patient ID: Suzanne Contreras, female   DOB: 05-Jun-1929, 85 y.o.   MRN: [id number] Unintelligible speech which appears to be baseline.  Wakens easily.  Able to have oxygen decreased to 5 L nasal cannula.  Has not required BiPAP overnight.  Hemodynamically stable, vital signs reviewed. Chronically ill-appearing woman, no distress. Moist oral mucosa, unintelligible speech. No JVD noted. Lungs with scattered rhonchi. Cardiac: Regular rate, ectopy Abdomen: Benign. Extremities significant muscle wasting.  Impression/plan  1.  Acute respiratory failure, due to pneumonia/nfluenza A month resolving continue to titrate oxygen for oxygen saturations of 88% or better.  2.  Severe dementia at baseline, this issue adds complexity to her management.  3.  Diastolic dysfunction of the left ventricle and moderate to severe pulmonary hypertension, symptomatic management only.  Stable for transfer to [place] floor.  Continue DNR/DNI status.  Recommend palliative approach.  Discussed with Dr. Darvin Neighbours.  Renold Don, MD [facility]

## 2018-05-29 LAB — GLUCOSE, CAPILLARY: Glucose-Capillary: 100 mg/dL — ABNORMAL HIGH (ref 70–99)

## 2018-05-29 MED ORDER — GLYCOPYRROLATE 0.2 MG/ML IJ SOLN
0.2000 mg | INTRAMUSCULAR | Status: DC | PRN
  Administered 2018-05-30: 10:00:00 0.2 mg via INTRAVENOUS
  Filled 2018-05-29 (×2): qty 1

## 2018-05-29 MED ORDER — ONDANSETRON HCL 4 MG/2ML IJ SOLN: 4.0000 mg | Freq: Four times a day (QID) | INTRAMUSCULAR | Status: DC | PRN

## 2018-05-29 MED ORDER — LORAZEPAM 1 MG PO TABS: 1.0000 mg | ORAL_TABLET | ORAL | Status: DC | PRN

## 2018-05-29 MED ORDER — GLYCOPYRROLATE 1 MG PO TABS
1.0000 mg | ORAL_TABLET | ORAL | Status: DC | PRN
  Filled 2018-05-29: qty 1

## 2018-05-29 MED ORDER — ONDANSETRON 4 MG PO TBDP
4.0000 mg | ORAL_TABLET | Freq: Four times a day (QID) | ORAL | Status: DC | PRN
  Filled 2018-05-29: qty 1

## 2018-05-29 MED ORDER — LORAZEPAM 2 MG/ML IJ SOLN
1.0000 mg | INTRAMUSCULAR | Status: DC | PRN
  Administered 2018-05-30: 1 mg via INTRAVENOUS
  Filled 2018-05-29: qty 1

## 2018-05-29 MED ORDER — LORAZEPAM 2 MG/ML PO CONC: 1.0000 mg | ORAL | Status: DC | PRN

## 2018-05-29 MED ORDER — GLYCOPYRROLATE 0.2 MG/ML IJ SOLN
0.2000 mg | INTRAMUSCULAR | Status: DC | PRN
  Filled 2018-05-29 (×2): qty 1

## 2018-05-29 MED ORDER — ACETAMINOPHEN 650 MG RE SUPP: 650.0000 mg | Freq: Four times a day (QID) | RECTAL | Status: DC | PRN

## 2018-05-29 MED ORDER — ACETAMINOPHEN 325 MG PO TABS: 650.0000 mg | ORAL_TABLET | Freq: Four times a day (QID) | ORAL | Status: DC | PRN

## 2018-05-29 MED ORDER — MORPHINE SULFATE (CONCENTRATE) 10 MG/0.5ML PO SOLN: 5.0000 mg | ORAL | Status: DC | PRN

## 2018-05-29 MED ORDER — MORPHINE SULFATE (PF) 2 MG/ML IV SOLN
2.0000 mg | INTRAVENOUS | Status: DC | PRN
  Administered 2018-05-29 – 2018-05-31 (×3): 2 mg via INTRAVENOUS
  Filled 2018-05-29 (×3): qty 1

## 2018-05-29 MED ORDER — MORPHINE SULFATE (CONCENTRATE) 10 MG/0.5ML PO SOLN
5.0000 mg | ORAL | Status: DC | PRN
  Administered 2018-05-30 (×2): 5 mg via SUBLINGUAL
  Filled 2018-05-29 (×2): qty 1

## 2018-05-29 NOTE — Progress Notes (Addendum)
Pnt Oxygen saturation overnight dropped into low 80's on 5L of O2 nasal cannula. In order to maintain saturations oxygen was initially increased to 6L with no change and then HFNC was increased to 10L, resulting in 92-93% O2. Respiratory assessed at bedside. Oxygen saturation seemed to improve with that along with yankeur to assist with maintaining secretions. Secretions were yellow and thick. Dr. Jannifer Franklin notified who then added robinul scheduled to assist in secretion management. Put in new speech evaluation as last one was 1/10 and it appears her ability to swallow has changed. Modified diet ordering pending until speech evaluation..    Febrile overnight at 101.5, tylenol suppository given which reduced temp to WNL.  HR intermittently in 120-140's overnight, sustained over 120 for some time. Notified Dr. Jannifer Franklin. IV metoprolol given (5mg ) which did reduce HR to  80-90's. Sinus tach. No other signs of distress.    In early am HR did have a couple seconds of sinus tachy in 140's but not sustained.

## 2018-05-29 NOTE — Progress Notes (Addendum)
Returned to the patient's room to talk to her son Thu Baggett and his wife at bedside.  Updated them regarding patient's condition.  They have seen a significant decline in her dementia and respiratory status.  We discussed regarding patient being on high flow nasal cannula and saturations still borderline.  Patient has poor oral intake.  Confused.  Has advanced dementia at baseline.  According to son patient seems tired and struggling to breathe.  Has declined significantly since admission in spite of treatment continues to get worse.  Updated them about increased oxygen requirement, if fevers, tachycardia.  Discussed regarding comfort versus aggressive care..  Family requesting transition to comfort measures after explaining.  Will lower oxygen to 2 L.  No need to check saturations or vitals.  Started morphine and Ativan as needed.  I returned to the patient's room at request of the nurse to meet additional family.  We went over above findings again along with my recommendations.  Another son of patient in the room and agrees with the plan.  Further critical care ime spent 40 minutes

## 2018-05-29 NOTE — Progress Notes (Signed)
[place] at [place] NAME: Suzanne Contreras    MR#:  [id number]  DATE OF BIRTH:  Jul 22, 1929  SUBJECTIVE:   Patient is drowsy and not eating.  Worsening respiratory status with fever overnight and tachycardia in the 140s.  Placed on high flow nasal cannula at 10 L.  Looks critically ill.  REVIEW OF SYSTEMS:  unable to obtain due to mental status  ROS  DRUG ALLERGIES:   Allergies  Allergen Reactions  . Penicillins     VITALS:  Blood pressure (!) 99/49, pulse 90, temperature (!) 97.4 F (36.3 C), temperature source Axillary, resp. rate 18, height 5' (1.524 m), weight 59.7 kg, SpO2 94 %.  PHYSICAL EXAMINATION:  GENERAL:  85 y.o.-year-old patient lying in the bed.  Looks critically ill with respiratory distress EYES: Pupils equal, round, reactive to light  HEENT: Head atraumatic, normocephalic. Oropharynx and nasopharynx clear.  NECK:  Supple, no jugular venous distention. No thyroid enlargement, no tenderness.  LUNGS: Coarse breath sounds bilaterally CARDIOVASCULAR: S1, S2 , tachycardia ABDOMEN: Soft, nontender, nondistended. Bowel sounds present. No organomegaly or mass.  EXTREMITIES: No pedal edema, cyanosis, or clubbing.  NEUROLOGIC: Not following instructions.  Moves all 4 extremities. PSYCHIATRIC: The patient is drowsy SKIN: No obvious rash, lesion, or ulcer.   Physical Exam LABORATORY PANEL:   CBC Recent Labs  Lab 05/28/18 0531  WBC 8.5  HGB 10.4*  HCT 32.4*  PLT 214   ------------------------------------------------------------------------------------------------------------------  Chemistries  Recent Labs  Lab 05/28/18 0531  NA 140  K 3.6  CL 101  CO2 29  GLUCOSE 116*  BUN 18  CREATININE 0.64  CALCIUM 8.1*  MG 1.7  AST 23  ALT 10  ALKPHOS 58  BILITOT 0.6   ------------------------------------------------------------------------------------------------------------------  Cardiac Enzymes Recent Labs  Lab  05/23/18 1245  TROPONINI <0.03   ------------------------------------------------------------------------------------------------------------------  RADIOLOGY:  No results found.  ASSESSMENT AND PLAN:  85 year old female with advanced Alzheimer's dementia comes from home because of worsening confusion now found to have sepsis due to influenza type B, pneumonia, UTI.  *Acute hypoxic respiratory failure Secondary to influenza A.  Worsening today significantly and patient is on high flow nasal cannula.  Critically ill.  Will try stat nebulizer treatment and increase oxygen as needed.  Will need to talk to family as patient seems appropriate to de-escalate care  *Severe sepsis-worsening today with further fever and tachycardia episodes Secondary to influenza type A, E. coli UTI, and CAP Continue sepsis protocol, urine cultures noted for E. Coli  * E. coli UTI  Continue IV Ancef given pansensitive E. coli on culture   * Influenza type A  Tamiflu twice daily, BiPAP/supplemental oxygen with weaning as tolerated   * severe advanced dementia with agitation Continue increased nursing care PRN, aspiration/fall/skin care precautions while in house, continue Risperdal, Depakote, palliative care consulted given long-term poor prognosis, Haldol as needed .  Appreciate input  * Recent skin cancer  Followed by [facility] dermatology-we will need follow-up status post discharge for continued care   Discussed care with Dr. Patsey Berthold in the ICU  All the records are reviewed and case discussed with Care Management/Social Workerr. Management plans discussed with the patient, family and they are in agreement.  CODE STATUS: DNR  TOTAL TIME TAKING CARE OF THIS PATIENT: 45 minutes.    Neita Carp M.D on 05/29/2018   Between 7am to 6pm - Pager - [phone]  After 6pm go to [email or web address] - [place]  [place]  Office  [phone]  CC: Primary care physician;  Raford Pitcher, MD  Note: This dictation was prepared with Dragon dictation along with smaller phrase technology. Any transcriptional errors that result from this process are unintentional.

## 2018-05-30 NOTE — Progress Notes (Signed)
New hospice home referral received from [facility]. Patient is an 85 year old woman followed by PACE, with a PMH of dementia, CAD, DM II and skin cancer admitted from home on 1/6 with decreased oxygen saturations, elevated lactic acid of 3.60, increased heart rate up to 130. She was found to have a UTI, and tested positive for influenza A as well as pneumonia. She has been treated with IV antibiotics and Tamaflu, she has however continued to decline, despite medical interventions. Attending physician Dr. Darvin Neighbours has met with patient's family and they have chosen to focus on comfort with transfer to the hospice home. Patient is not eating and has required IV robinul and oral morphine for symptom management. Writer met in the family room with patient's son Suzanne Contreras and daughter in law Suzanne Contreras to initiate education regarding hospice services, philosophy and team approach to care with understanding voiced. Questions answered, consents signed.  Plan is for discharge to the hospice home tomorrow via EMS with out of facility DNR in place. Hospital care team updated. Patient information faxed to referral. Will continue to follow through discharge. Thank you. Flo Shanks RN, BSN, [facility], hospital Liaison [phone]

## 2018-05-30 NOTE — Clinical Social Work Note (Signed)
CSW received consult for hospice home placement. CSW met with patient's daughter Valerie Shoe and son Andy Belinsky at bedside. Both children are in agreement with discharge to hospice home. Both children request hospice home of [place]. CSW notified Karen, hospice liaison of referral. CSW will continue to follow for discharge planning.     MSW, LCSWA [phone] 

## 2018-05-30 NOTE — Care Management Important Message (Signed)
Important Message  Patient Details  Name: Suzanne Contreras MRN: [id number] Date of Birth: 14-Sep-1929   Medicare Important Message Given:  Yes    Shelbie Ammons, RN 05/30/2018, 2:03 PM

## 2018-05-30 NOTE — Progress Notes (Signed)
[place] [facility] [place] NAME: Suzanne Contreras    MR#:  [id number]  DATE OF BIRTH:  1929/12/18  SUBJECTIVE:   Admitted for influenza and UTI.  Continued to worsen in spite of aggressive treatment.  Transition to comfort measures on 05/29/2018.  Was on high flow nasal cannula.  Today she she has been taken off oxygen as she keeps pulling it off.  Able to talk some but is confused.  Family at bedside.  Has not eaten anything in 1 week per family.  REVIEW OF SYSTEMS:  unable to obtain due to mental status  ROS  DRUG ALLERGIES:   Allergies  Allergen Reactions  . Penicillins     VITALS:  Blood pressure (!) 99/49, pulse 87, temperature (!) 97.4 F (36.3 C), temperature source Axillary, resp. rate 18, height 5' (1.524 m), weight 59.7 kg, SpO2 92 %.  PHYSICAL EXAMINATION:  GENERAL:  85 y.o.-year-old patient lying in the bed.  EYES: Pupils equal, round, reactive to light  HEENT: Head atraumatic, normocephalic. Oropharynx and nasopharynx clear.  NECK:  Supple, no jugular venous distention. No thyroid enlargement, no tenderness.  LUNGS: Coarse breath sounds bilaterally CARDIOVASCULAR: S1, S2 , tachycardia ABDOMEN: Soft, nontender, nondistended. Bowel sounds present. No organomegaly or mass.  EXTREMITIES: No pedal edema, cyanosis, or clubbing.  NEUROLOGIC: Not following instructions.  Moves all 4 extremities. PSYCHIATRIC: The patient is drowsy SKIN: No obvious rash, lesion, or ulcer.   Physical Exam LABORATORY PANEL:   CBC Recent Labs  Lab 05/28/18 0531  WBC 8.5  HGB 10.4*  HCT 32.4*  PLT 214   ------------------------------------------------------------------------------------------------------------------  Chemistries  Recent Labs  Lab 05/28/18 0531  NA 140  K 3.6  CL 101  CO2 29  GLUCOSE 116*  BUN 18  CREATININE 0.64  CALCIUM 8.1*  MG 1.7  AST 23  ALT 10  ALKPHOS 58  BILITOT 0.6    ------------------------------------------------------------------------------------------------------------------  Cardiac Enzymes Recent Labs  Lab 05/23/18 1245  TROPONINI <0.03   ------------------------------------------------------------------------------------------------------------------  RADIOLOGY:  No results found.  ASSESSMENT AND PLAN:  85 year old female with advanced Alzheimer's dementia comes from home because of worsening confusion now found to have sepsis due to influenza type B, pneumonia, UTI.  *Acute hypoxic respiratory failure *Severe sepsis * E. coli UTI  * Influenza type A * severe advanced dementia with agitation *Acute metabolic encephalopathy * Recent skin cancer  *Severe protein calorie malnutrition *Anemia of chronic disease  Patient on comfort measures.  Doing well.  Will be transferred to hospice home when bed available.  All the records are reviewed and case discussed with Care Management/Social Workerr. Management plans discussed with the patient, family and they are in agreement.  CODE STATUS: DNR  TOTAL TIME TAKING CARE OF THIS PATIENT: 35 minutes.  Greater than 50% time spent in discussing and coordinating care at bedside  Neita Carp M.D on 05/30/2018   Between 7am to 6pm - Pager - [phone]  After 6pm go to [email or web address] - password [email or web address] [facility]  Office  [phone]  CC: Primary care physician; Raford Pitcher, MD  Note: This dictation was prepared with Dragon dictation along with smaller phrase technology. Any transcriptional errors that result from this process are unintentional.

## 2018-05-31 NOTE — Discharge Summary (Signed)
[place] [facility] [place] NAME: Suzanne Contreras    MR#:  [id number]  DATE OF BIRTH:  09-17-1929  DATE OF ADMISSION:  05/23/2018 ADMITTING PHYSICIAN: [name], MD  DATE OF DISCHARGE: 05/31/2018  PRIMARY CARE PHYSICIAN: Raford Pitcher, MD   ADMISSION DIAGNOSIS:  Bronchospasm [J98.01] Influenza [J11.1] Urinary tract infection without hematuria, site unspecified [N39.0] Sepsis, due to unspecified organism, unspecified whether acute organ dysfunction present ([place]) [A41.9]  DISCHARGE DIAGNOSIS:  Active Problems:   Sepsis ([place])   Influenza   Goals of care, counseling/discussion   Palliative care by specialist   Alzheimer's dementia with behavioral disturbance ([place])   Pressure injury of skin   Acute respiratory failure with hypoxia ([place])   SECONDARY DIAGNOSIS:   Past Medical History:  Diagnosis Date  . Dementia ([place])   . Diabetes mellitus without complication ([place])   . Skin cancer      ADMITTING HISTORY  HISTORY OF PRESENT ILLNESS:  Suzanne Contreras  is a 85 y.o. female with a known history of Alzheimer's dementia, admitted by EMS because of hypoxia with O2 sats 88% on room air, confusion.  History obtained from ER charts.  Patient very confused, unable to give any history now.  Found to have UTI, pneumonia on blood work.  She is now tachycardic with heart rate up to 130 bpm, she has sepsis with elevated lactic acid to 3.60 due to UTI, pneumonia.  Admitting for the same.  HOSPITAL COURSE:   85 year old female with advanced Alzheimer's dementia comes from home because of worsening confusion now found to have sepsis due to influenza type B, pneumonia, UTI.  *Acute hypoxic respiratory failure *Severesepsis * E. coli UTI  * Influenza type A * severe advanced dementia with agitation *Acute metabolic encephalopathy * Recent skin cancer  *Severe protein calorie malnutrition *Anemia of chronic disease  Treated aggressively in ICU  and later on medical floor but continued to deteriorate. Discussed with family and transitioned to comfort care. Has not eaten in 1 week now.  Patient on comfort measures.  Doing well.  Will be transferred to hospice home for end of life care  Life expectancy < 2 weeks  CONSULTS OBTAINED:    DRUG ALLERGIES:   Allergies  Allergen Reactions  . Penicillins     DISCHARGE MEDICATIONS:   Allergies as of 05/31/2018      Reactions   Penicillins       Medication List    STOP taking these medications   divalproex 125 MG capsule Commonly known as:  DEPAKOTE SPRINKLE   Melatonin 5 MG Tabs   memantine 5 MG tablet Commonly known as:  NAMENDA   OLANZapine 2.5 MG tablet Commonly known as:  ZYPREXA       Today   VITAL SIGNS:  Blood pressure 108/70, pulse 92, temperature 98.3 F (36.8 C), temperature source Oral, resp. rate 16, height 5' (1.524 m), weight 59.7 kg, SpO2 (!) 88 %.  I/O:    Intake/Output Summary (Last 24 hours) at 05/31/2018 1007 Last data filed at 05/30/2018 1634 Gross per 24 hour  Intake -  Output 175 ml  Net -175 ml    PHYSICAL EXAMINATION:  Physical Exam  GENERAL:  85 y.o.-year-old patient lying in the bed PSYCHIATRIC: The patient is confused.   DATA REVIEW:   CBC Recent Labs  Lab 05/28/18 0531  WBC 8.5  HGB 10.4*  HCT 32.4*  PLT 214    Chemistries  Recent Labs  Lab 05/28/18  0531  NA 140  K 3.6  CL 101  CO2 29  GLUCOSE 116*  BUN 18  CREATININE 0.64  CALCIUM 8.1*  MG 1.7  AST 23  ALT 10  ALKPHOS 58  BILITOT 0.6    Cardiac Enzymes No results for input(s): TROPONINI in the last 168 hours.  Microbiology Results  Results for orders placed or performed during the hospital encounter of 05/23/18  Blood Culture (routine x 2)     Status: None   Collection Time: 05/23/18 12:45 PM  Result Value Ref Range Status   Specimen Description BLOOD LEFT HAND  Final   Special Requests   Final    BOTTLES DRAWN AEROBIC AND ANAEROBIC Blood  Culture results may not be optimal due to an inadequate volume of blood received in culture bottles   Culture   Final    NO GROWTH 5 DAYS Performed at [facility], [address] [place], [place] [address]    Report Status 05/28/2018 FINAL  Final  Urine culture     Status: Abnormal   Collection Time: 05/23/18 12:45 PM  Result Value Ref Range Status   Specimen Description   Final    URINE, CATHETERIZED Performed at [facility], [address] [place], [place] [address]    Special Requests   Final    NONE Performed at [facility], [address] [place], [place] [address]    Culture >=100,000 COLONIES/mL ESCHERICHIA COLI (A)  Final   Report Status 05/26/2018 FINAL  Final   Organism ID, Bacteria ESCHERICHIA COLI (A)  Final      Susceptibility   Escherichia coli - MIC*    AMPICILLIN 8 SENSITIVE Sensitive     CEFAZOLIN <=4 SENSITIVE Sensitive     CEFTRIAXONE <=1 SENSITIVE Sensitive     CIPROFLOXACIN <=0.25 SENSITIVE Sensitive     GENTAMICIN <=1 SENSITIVE Sensitive     IMIPENEM <=0.25 SENSITIVE Sensitive     NITROFURANTOIN <=16 SENSITIVE Sensitive     TRIMETH/SULFA <=20 SENSITIVE Sensitive     AMPICILLIN/SULBACTAM 4 SENSITIVE Sensitive     PIP/TAZO <=4 SENSITIVE Sensitive     Extended ESBL NEGATIVE Sensitive     * >=100,000 COLONIES/mL ESCHERICHIA COLI  Blood Culture (routine x 2)     Status: None   Collection Time: 05/23/18 12:46 PM  Result Value Ref Range Status   Specimen Description BLOOD LEFT HAND  Final   Special Requests   Final    BOTTLES DRAWN AEROBIC AND ANAEROBIC Blood Culture results may not be optimal due to an inadequate volume of blood received in culture bottles   Culture   Final    NO GROWTH 5 DAYS Performed at [facility], [address] [place], [place] [address]    Report Status 05/28/2018 FINAL  Final  Urine culture     Status: Abnormal   Collection Time: 05/23/18 12:46 PM  Result Value Ref Range Status    Specimen Description   Final    URINE, RANDOM Performed at [facility], [address], [place], [place] [address]    Special Requests   Final    NONE Performed at [facility], [address] [place], [place] [address]    Culture >=100,000 COLONIES/mL ESCHERICHIA COLI (A)  Final   Report Status 05/25/2018 FINAL  Final   Organism ID, Bacteria ESCHERICHIA COLI (A)  Final      Susceptibility   Escherichia coli - MIC*    AMPICILLIN 8 SENSITIVE Sensitive  CEFAZOLIN <=4 SENSITIVE Sensitive     CEFTRIAXONE <=1 SENSITIVE Sensitive     CIPROFLOXACIN <=0.25 SENSITIVE Sensitive     GENTAMICIN <=1 SENSITIVE Sensitive     IMIPENEM <=0.25 SENSITIVE Sensitive     NITROFURANTOIN <=16 SENSITIVE Sensitive     TRIMETH/SULFA <=20 SENSITIVE Sensitive     AMPICILLIN/SULBACTAM 4 SENSITIVE Sensitive     PIP/TAZO <=4 SENSITIVE Sensitive     Extended ESBL NEGATIVE Sensitive     * >=100,000 COLONIES/mL ESCHERICHIA COLI  MRSA PCR Screening     Status: None   Collection Time: 05/25/18  7:50 PM  Result Value Ref Range Status   MRSA by PCR NEGATIVE NEGATIVE Final    Comment:        The GeneXpert MRSA Assay (FDA approved for NASAL specimens only), is one component of a comprehensive MRSA colonization surveillance program. It is not intended to diagnose MRSA infection nor to guide or monitor treatment for MRSA infections. Performed at [facility], [address], [place], [place] [address]     RADIOLOGY:  No results found.  Follow up with PCP in 1 week.  Management plans discussed with the patient, family and they are in agreement.  CODE STATUS:     Code Status Orders  (From admission, onward)         Start     Ordered   05/29/18 0920  Do not attempt resuscitation (DNR)  Continuous    Question Answer Comment  In the event of cardiac or respiratory ARREST Do not call a "code blue"   In the event of cardiac or respiratory ARREST Do not perform  Intubation, CPR, defibrillation or ACLS   In the event of cardiac or respiratory ARREST Use medication by any route, position, wound care, and other measures to relive pain and suffering. May use oxygen, suction and manual treatment of airway obstruction as needed for comfort.   Comments NMP      05/29/18 0920        Code Status History    Date Active Date Inactive Code Status Order ID Comments User Context   05/23/2018 9030 05/29/2018 0920 DNR [id number]  [name], MD ED   03/15/2017 0149 03/17/2017 1722 DNR [id number]  Salary, Avel Peace, MD Inpatient   12/10/2016 1942 12/14/2016 2121 Partial Code [id number]  [name], RN Inpatient   12/10/2016 1317 12/10/2016 1942 Full Code [id number]  [name], MD Inpatient   12/10/2016 1221 12/10/2016 1317 Partial Code [id number] Discussed with patient's daughter, Mrs. Shoe today [name], MD ED    Advance Directive Documentation     Most Recent Value  Type of Advance Directive  Out of facility DNR (pink MOST or yellow form)  Pre-existing out of facility DNR order (yellow form or pink MOST form)  Yellow form placed in chart (order not valid for inpatient use)  "MOST" Form in Place?  -      TOTAL TIME TAKING CARE OF THIS PATIENT ON DAY OF DISCHARGE: more than 30 minutes.   Neita Carp M.D on 05/31/2018 at 10:07 AM  Between 7am to 6pm - Pager - [phone]  After 6pm go to [email or web address] - password [email or web address] [facility]  Office  [phone]  CC: Primary care physician; Raford Pitcher, MD  Note: This dictation was prepared with Dragon dictation along with smaller phrase technology. Any transcriptional errors that result from this process are unintentional.

## 2018-05-31 NOTE — Progress Notes (Signed)
Follow up visit made to new hospice home referral. Patient seen lying in bed, appeared to be sleeping soundly. Last given IV morphine 2 mg at 5:30 am. Patient son Jonni Sanger and daughter in Radio producer at bedside. Both remain in agreement with discharge plan of transfer to the hospice home today. Report called to the Hospice home, Hospital care team and family updated, EMS notified for transport. Discharge summary faxed to referral.  Flo Shanks RN, BSN, [facility], hospital Liaison [phone]

## 2018-05-31 NOTE — Plan of Care (Signed)
Pt d/ced to Hospice Home.  Pt resting comfortably.  No s/sx of distress.  She is d/ced with foley for end of life care and IV.  Family at bedside.  Hospice liason arranged transport. She didn't require any medication for transport.

## 2018-05-31 NOTE — Clinical Social Work Note (Signed)
Patient will discharge to hospice home today. Family is aware. Patient will be transported by EMS. Santiago Glad, hospice liaison will call report and call for transport.   [place], [place]

## 2018-06-18 DEATH — deceased

## 2018-12-24 IMAGING — US US EXTREM LOW VENOUS BILAT
1 series · 13 of 24 positions shown · non-contrast
Comparison: None.

CLINICAL DATA: Bilateral leg pain and swelling



[Series 1: us extrem low venous bilat · 0.06mm/px · 13 of 76 slices shown]
[im 1/76]
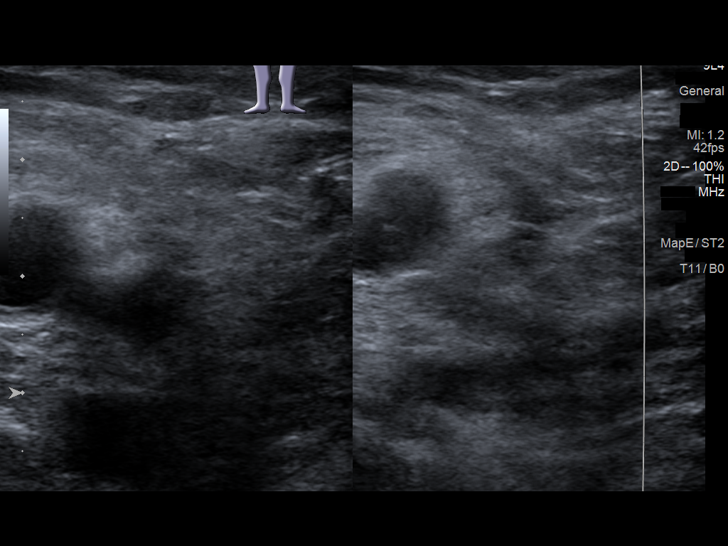
[im 7/76]
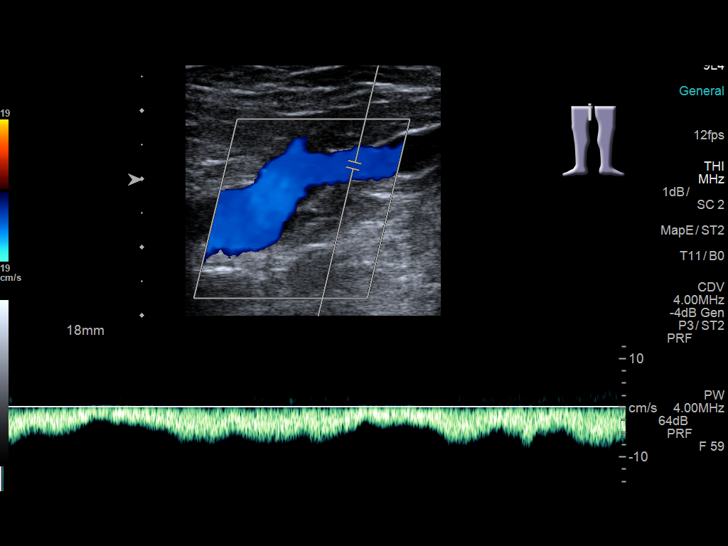
[im 14/76]
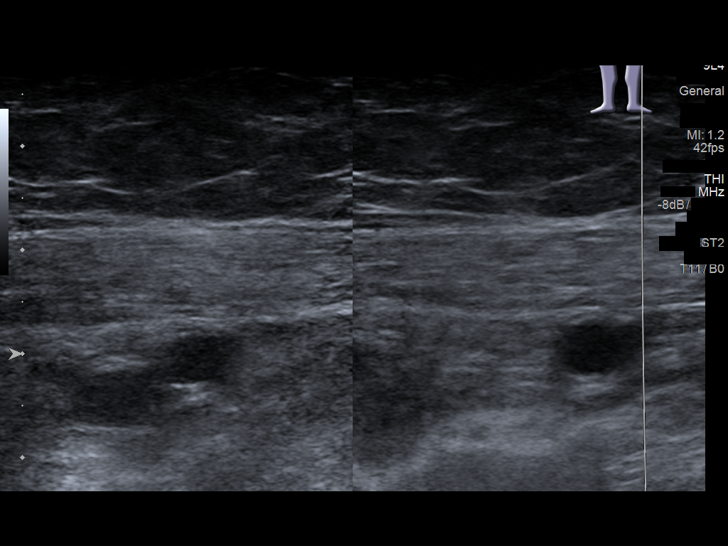
[im 20/76]
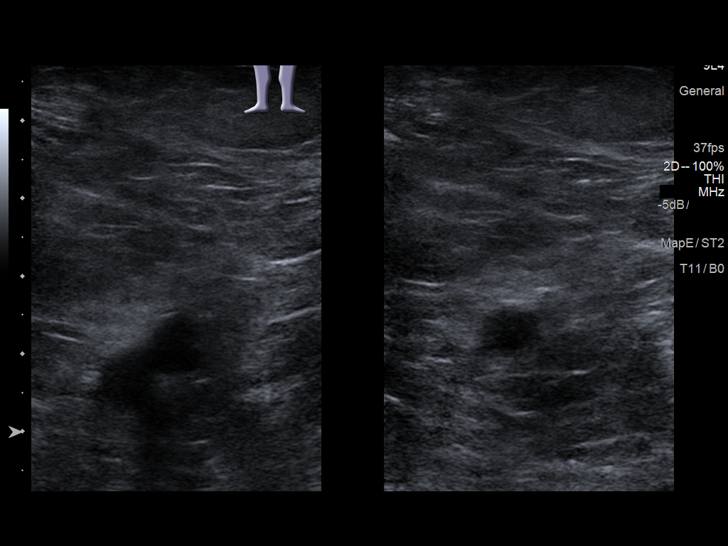
[im 27/76]
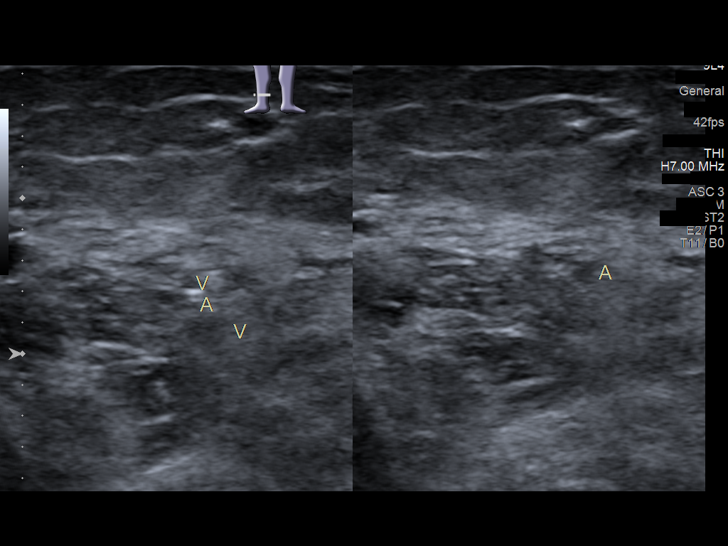
[im 33/76]
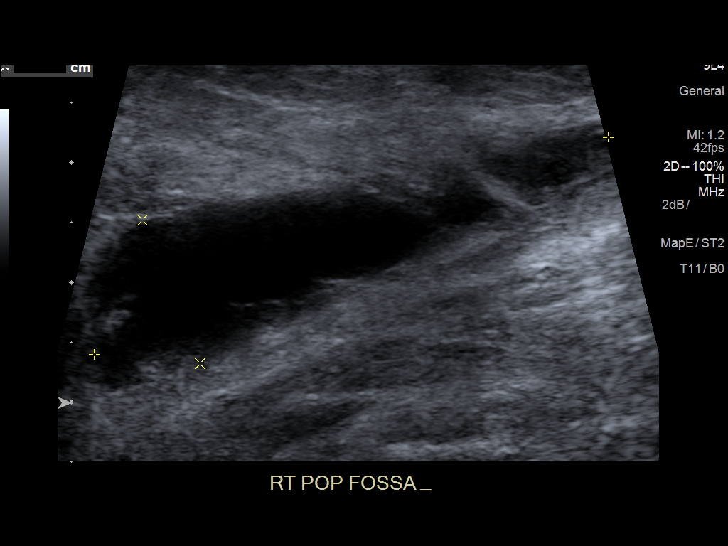
[im 40/76]
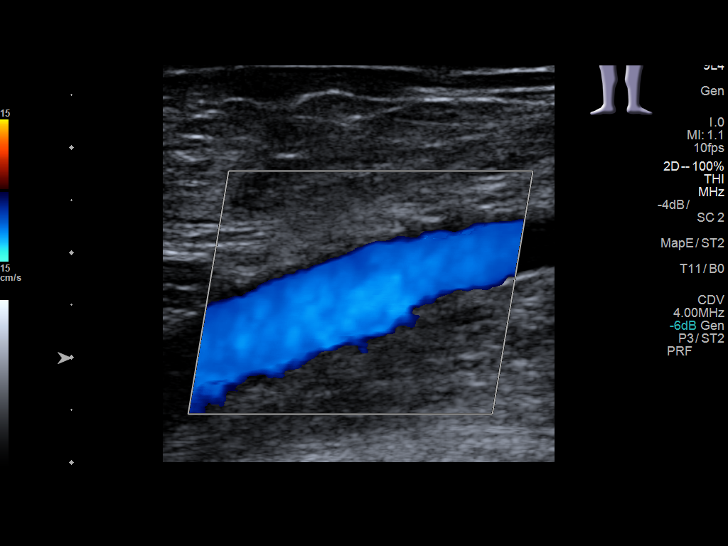
[im 43/76]
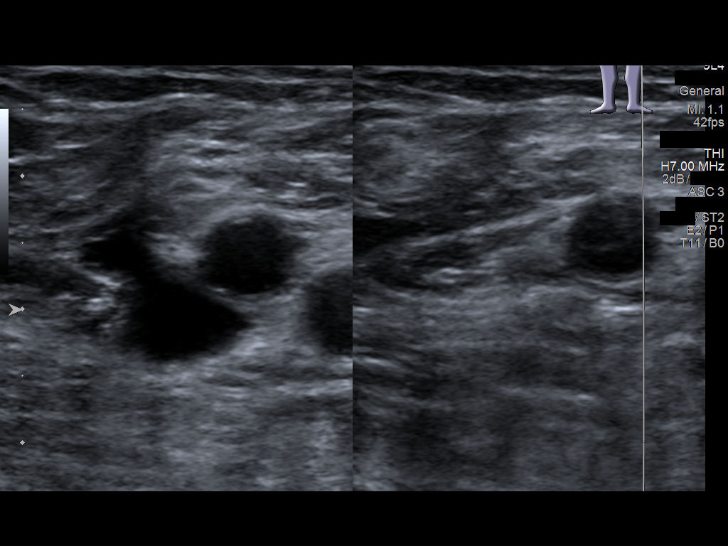
[im 49/76]
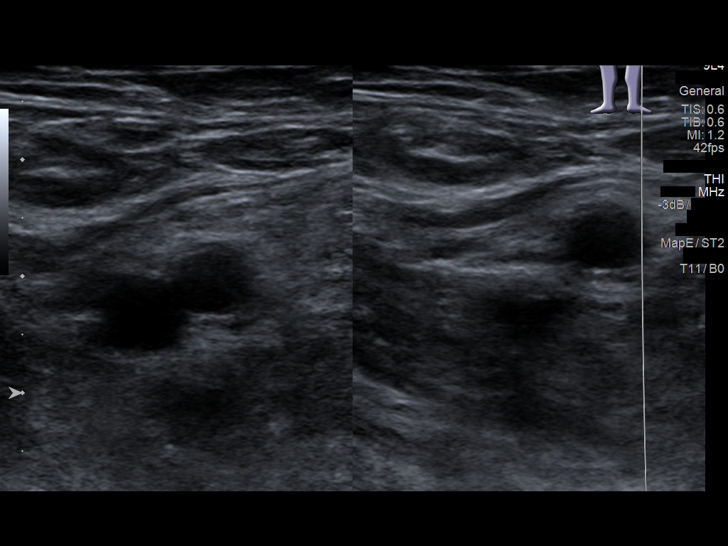
[im 56/76]
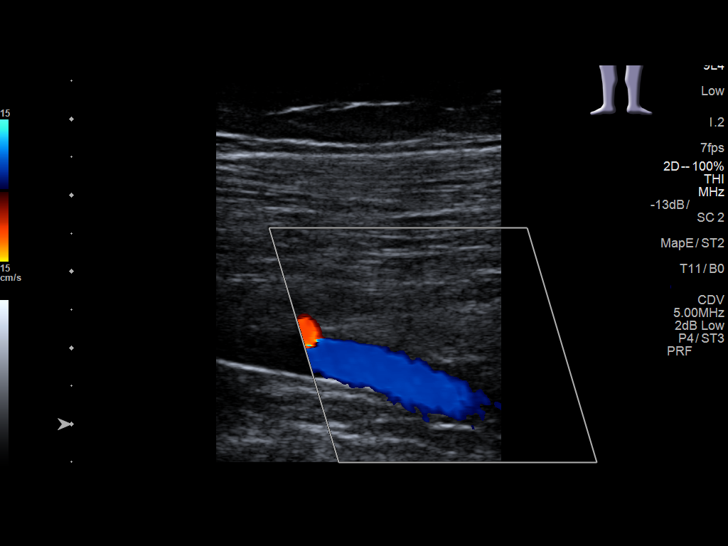
[im 62/76]
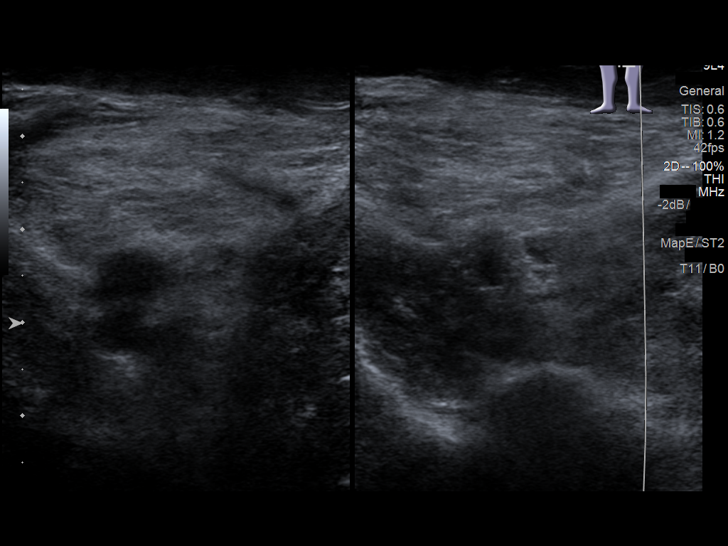
[im 69/76]
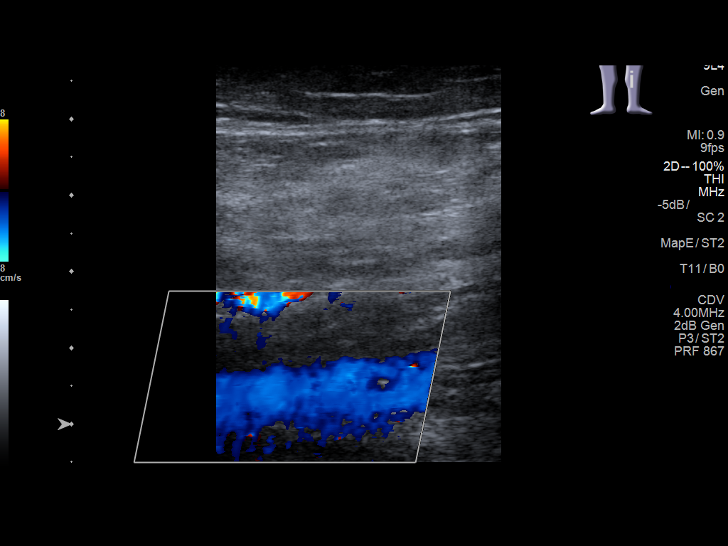
[im 76/76]
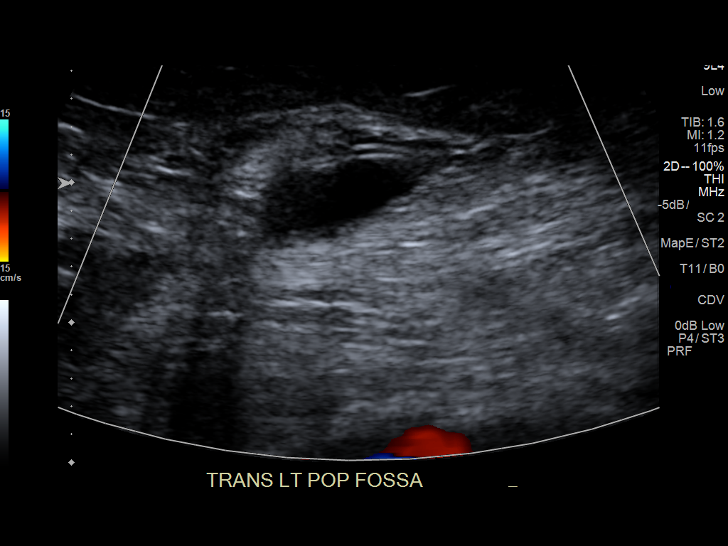

[13 of 24 positions shown; findings below may reference images not displayed]

FINDINGS: RIGHT LOWER EXTREMITY

Common Femoral Vein: No evidence of thrombus. Normal
compressibility, respiratory phasicity and response to augmentation.

Saphenofemoral Junction: No evidence of thrombus. Normal
compressibility and flow on color Doppler imaging.

Profunda Femoral Vein: No evidence of thrombus. Normal
compressibility and flow on color Doppler imaging.

Femoral Vein: No evidence of thrombus. Normal compressibility,
respiratory phasicity and response to augmentation.

Popliteal Vein: No evidence of thrombus. Normal compressibility,
respiratory phasicity and response to augmentation.

Calf Veins: No evidence of thrombus. Normal compressibility and flow
on color Doppler imaging.

Superficial Great Saphenous Vein: No evidence of thrombus. Normal
compressibility.

Venous Reflux:  None.

Other Findings: 4.7 cm popliteal cyst is noted within the popliteal
fossa.

LEFT LOWER EXTREMITY

Common Femoral Vein: No evidence of thrombus. Normal
compressibility, respiratory phasicity and response to augmentation.

Saphenofemoral Junction: No evidence of thrombus. Normal
compressibility and flow on color Doppler imaging.

Profunda Femoral Vein: No evidence of thrombus. Normal
compressibility and flow on color Doppler imaging.

Femoral Vein: No evidence of thrombus. Normal compressibility,
respiratory phasicity and response to augmentation.

Popliteal Vein: No evidence of thrombus. Normal compressibility,
respiratory phasicity and response to augmentation.

Calf Veins: No evidence of thrombus. Normal compressibility and flow
on color Doppler imaging.

Superficial Great Saphenous Vein: No evidence of thrombus. Normal
compressibility.

Venous Reflux:  None.

Other Findings:  3.0 cm left popliteal cyst is noted.
IMPRESSION: No evidence of deep venous thrombosis.

Bilateral popliteal cysts.
